# Patient Record
Sex: Male | Born: 1990 | Race: Black or African American | Hispanic: No | Marital: Single
Health system: Southern US, Community
[De-identification: ages and names within clinical notes are randomized; demographics above are authoritative.]

---
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
## 2011-05-14 ENCOUNTER — Emergency Department (INDEPENDENT_AMBULATORY_CARE_PROVIDER_SITE_OTHER)
Admission: EM | Admit: 2011-05-14 | Discharge: 2011-05-14 | Disposition: A | Discharge status: Home / Self Care | Payer: Self-pay | Source: Home / Self Care | Attending: Emergency Medicine | Admitting: Emergency Medicine

## 2011-05-14 ENCOUNTER — Encounter (HOSPITAL_COMMUNITY): Payer: Self-pay | Admitting: Emergency Medicine

## 2011-05-14 DIAGNOSIS — K529 Noninfective gastroenteritis and colitis, unspecified: Secondary | ICD-10-CM

## 2011-05-14 DIAGNOSIS — K5289 Other specified noninfective gastroenteritis and colitis: Secondary | ICD-10-CM

## 2011-05-14 MED ORDER — ONDANSETRON 8 MG PO TBDP: 8.0000 mg | ORAL_TABLET | Freq: Three times a day (TID) | ORAL | Status: AC | PRN

## 2011-05-14 MED ORDER — SODIUM CHLORIDE 0.9 % IV SOLN: Freq: Once | INTRAVENOUS | Status: AC

## 2011-05-14 MED ORDER — ONDANSETRON 4 MG PO TBDP
8.0000 mg | ORAL_TABLET | Freq: Once | ORAL | Status: AC
  Administered 2011-05-14: 8 mg via ORAL

## 2011-05-14 MED ORDER — SODIUM CHLORIDE 0.9 % IV SOLN
INTRAVENOUS | Status: DC
  Administered 2011-05-14: 17:00:00 via INTRAVENOUS

## 2011-05-14 MED ORDER — ONDANSETRON 4 MG PO TBDP
ORAL_TABLET | ORAL | Status: AC
  Filled 2011-05-14: qty 2

## 2011-05-14 MED ORDER — DIPHENOXYLATE-ATROPINE 2.5-0.025 MG PO TABS: 1.0000 | ORAL_TABLET | Freq: Four times a day (QID) | ORAL | Status: AC | PRN

## 2011-05-14 NOTE — Discharge Instructions (Signed)

## 2011-05-14 NOTE — ED Provider Notes (Signed)
Chief Complaint  Patient presents with  . Emesis    History of Present Illness:   The patient woke up at 1 AM this morning with nausea, vomiting, and diarrhea. There was no blood in the vomitus or stool. He's vomited about 6 times and had 6 or 7 loose stools. He has some pain in the upper abdomen and chest before he vomits or has diarrhea which is relieved by vomiting or having diarrhea. He's had chills and sweats. He denies any nasal congestion, rhinorrhea, sore throat, or cough. He was exposed to a similar illness in several family members.  Review of Systems:  Other than noted above, the patient denies any of the following symptoms: Constitutional:  No fever, chills, fatigue, weight loss or anorexia. Lungs:  No cough or shortness of breath. Heart:  No chest pain, palpitations, syncope or edema. Abdomen:  No nausea, vomiting, hematememesis, melena, diarrhea, or hematochezia. GU:  No dysuria, frequency, urgency, or hematuria. Skin:  No rash or itching.  PMFSH:  Past medical history, family history, social history, meds, and allergies were reviewed.  Physical Exam:   Vital signs:  BP 119/82  Pulse 82  Temp(Src) 98.9 F (37.2 C) (Oral)  Resp 16  SpO2 100% Gen:  Alert, oriented, in no distress. Lungs:  Breath sounds clear and equal bilaterally.  No wheezes, rales or rhonchi. Heart:  Regular rhythm.  No gallops or murmers.   Abdomen:  Abdomen was soft, flat, nondistended. He has mild, generalized tenderness to palpation, most markedly in the left lower quadrant and left flank area. There is no organomegaly or mass. Bowel sounds are normally active. Skin:  Clear, warm and dry.  No rash.  Course in Urgent Care Center:   He was given Zofran ODT 8 mg sublingually followed by 1 L of normal saline. Thereafter he felt considerably better. He denies any abdominal pain or vomiting or nausea. He felt like he could go home. He was instructed to follow only a liquid diet tonight and advance to a light  diet tomorrow.   Assessment:   Diagnoses that have been ruled out:  None  Diagnoses that are still under consideration:  None  Final diagnoses:  Gastroenteritis    Plan:   1.  The following meds were prescribed:   New Prescriptions   DIPHENOXYLATE-ATROPINE (LOMOTIL) 2.5-0.025 MG PER TABLET    Take 1 tablet by mouth 4 (four) times daily as needed for diarrhea or loose stools.   ONDANSETRON (ZOFRAN ODT) 8 MG DISINTEGRATING TABLET    Take 1 tablet (8 mg total) by mouth every 8 (eight) hours as needed for nausea.   2.  The patient was instructed in symptomatic care and handouts were given. 3.  The patient was told to return if becoming worse in any way, if no better in 3 or 4 days, and given some red flag symptoms that would indicate earlier return.   Roque Lias, MD 05/14/11 [phone]

## 2011-05-14 NOTE — ED Notes (Signed)
Pt having nausea, vomiting and diarrhea since last night. Not able to keep anything down.Abd pain before each emesis. No other complaints.

## 2011-09-19 ENCOUNTER — Emergency Department (HOSPITAL_COMMUNITY)
Admission: EM | Admit: 2011-09-19 | Discharge: 2011-09-19 | Disposition: A | Discharge status: Home / Self Care | Payer: Self-pay | Attending: Emergency Medicine | Admitting: Emergency Medicine

## 2011-09-19 ENCOUNTER — Encounter (HOSPITAL_COMMUNITY): Payer: Self-pay | Admitting: *Deleted

## 2011-09-19 DIAGNOSIS — K0889 Other specified disorders of teeth and supporting structures: Secondary | ICD-10-CM

## 2011-09-19 DIAGNOSIS — F17200 Nicotine dependence, unspecified, uncomplicated: Secondary | ICD-10-CM | POA: Insufficient documentation

## 2011-09-19 DIAGNOSIS — K089 Disorder of teeth and supporting structures, unspecified: Secondary | ICD-10-CM | POA: Insufficient documentation

## 2011-09-19 MED ORDER — NAPROXEN 500 MG PO TABS: 500.0000 mg | ORAL_TABLET | Freq: Two times a day (BID) | ORAL | Status: DC | PRN

## 2011-09-19 MED ORDER — IBUPROFEN 200 MG PO TABS
400.0000 mg | ORAL_TABLET | Freq: Once | ORAL | Status: AC
  Administered 2011-09-19: 400 mg via ORAL
  Filled 2011-09-19: qty 2

## 2011-09-19 MED ORDER — OXYCODONE-ACETAMINOPHEN 5-325 MG PO TABS: 1.0000 | ORAL_TABLET | ORAL | Status: AC | PRN

## 2011-09-19 NOTE — ED Notes (Signed)
Pt states he is having dental pain. Pt states dental has been increasing for the past year. Pt is c/o right side dental pain

## 2011-09-19 NOTE — ED Provider Notes (Signed)
History    21yM with toothache. Has botered intermittently for year. Acutely worse the last couple days. Right lower tooth. Denies trauma. No fevers or chills. Hot and cold sensitivity. No difficulty breathing or swallowing. No nausea or vomiting. Denies history of diabetes. Is a smoker.  CSN: [id number]  Arrival date & time 09/19/11  1950   First MD Initiated Contact with Patient 09/19/11 2057      Chief Complaint  Patient presents with  . Dental Pain    (Consider location/radiation/quality/duration/timing/severity/associated sxs/prior treatment) HPI  History reviewed. No pertinent past medical history.  History reviewed. No pertinent past surgical history.  No family history on file.  History  Substance Use Topics  . Smoking status: Current Everyday Smoker  . Smokeless tobacco: Not on file  . Alcohol Use: Yes     Occasional      Review of Systems   Review of symptoms negative unless otherwise noted in HPI.   Allergies  Review of patient's allergies indicates no known allergies.  Home Medications   Current Outpatient Rx  Name Route Sig Dispense Refill  . NAPROXEN 500 MG PO TABS Oral Take 1 tablet (500 mg total) by mouth 2 (two) times daily as needed. 20 tablet 0  . OXYCODONE-ACETAMINOPHEN 5-325 MG PO TABS Oral Take 1-2 tablets by mouth every 4 (four) hours as needed for pain. 15 tablet 0    BP 126/73  Pulse 63  Temp 98.5 F (36.9 C) (Oral)  Resp 18  SpO2 100%  Physical Exam  Nursing note and vitals reviewed. Constitutional: He appears well-developed and well-nourished. No distress.  HENT:  Head: Normocephalic and atraumatic.       R lower premolar cracked. No increasing pain with percussion. Mild surrounding gingivitis. No drainable collection. Posterior pharynx clear. No tongue elevation. Submental tissues soft. Neck supple. No stridor. No adenopathy.  Eyes: Conjunctivae are normal. Right eye exhibits no discharge. Left eye exhibits no discharge.    Neck: Normal range of motion. Neck supple.  Cardiovascular: Normal rate, regular rhythm and normal heart sounds.  Exam reveals no gallop and no friction rub.   No murmur heard. Pulmonary/Chest: Effort normal and breath sounds normal. No respiratory distress.  Musculoskeletal: He exhibits no edema and no tenderness.  Lymphadenopathy:    He has no cervical adenopathy.  Neurological: He is alert.  Skin: Skin is warm and dry.  Psychiatric: He has a normal mood and affect. His behavior is normal. Thought content normal.    ED Course  NERVE BLOCK Date/Time: 09/19/2011 11:23 PM Performed by: Raeford Razor Authorized by: Raeford Razor Consent: Verbal consent obtained. Patient identity confirmed: verbally with patient, arm band and provided demographic data Indications: pain relief Body area: face/mouth Nerve: inferior alveolar Laterality: right Patient sedated: no Patient position: sitting Needle gauge: 27 G Local anesthetic: lidocaine 2% with epinephrine Anesthetic total: 2 ml Outcome: pain improved Patient tolerance: Patient tolerated the procedure well with no immediate complications.   (including critical care time)  Labs Reviewed - No data to display No results found.   1. Toothache       MDM  21yM with toothache. No signs of infection. Nerve block done. Good analgesia. Additional pain medication and dental resources provided. Return precautions discussed.        Raeford Razor, MD 09/19/11 [phone]

## 2011-10-07 ENCOUNTER — Emergency Department (HOSPITAL_COMMUNITY)
Admission: EM | Admit: 2011-10-07 | Discharge: 2011-10-07 | Disposition: A | Discharge status: Home / Self Care | Payer: Self-pay | Attending: Emergency Medicine | Admitting: Emergency Medicine

## 2011-10-07 ENCOUNTER — Encounter (HOSPITAL_COMMUNITY): Payer: Self-pay

## 2011-10-07 DIAGNOSIS — R4182 Altered mental status, unspecified: Secondary | ICD-10-CM | POA: Insufficient documentation

## 2011-10-07 DIAGNOSIS — F17200 Nicotine dependence, unspecified, uncomplicated: Secondary | ICD-10-CM | POA: Insufficient documentation

## 2011-10-07 LAB — BASIC METABOLIC PANEL
BUN: 12 mg/dL (ref 6–23)
CO2: 22 mEq/L (ref 19–32)
Chloride: 102 mEq/L (ref 96–112)
Glucose, Bld: 111 mg/dL — ABNORMAL HIGH (ref 70–99)
Potassium: 3.6 mEq/L (ref 3.5–5.1)
Sodium: 139 mEq/L (ref 135–145)

## 2011-10-07 LAB — CBC WITH DIFFERENTIAL/PLATELET
Hemoglobin: 14.7 g/dL (ref 13.0–17.0)
Lymphocytes Relative: 43 % (ref 12–46)
Lymphs Abs: 3.6 10*3/uL (ref 0.7–4.0)
MCH: 31.3 pg (ref 26.0–34.0)
MCV: 87.6 fL (ref 78.0–100.0)
Monocytes Relative: 6 % (ref 3–12)
Neutrophils Relative %: 50 % (ref 43–77)
Platelets: 280 10*3/uL (ref 150–400)
RBC: 4.69 MIL/uL (ref 4.22–5.81)
WBC: 8.5 10*3/uL (ref 4.0–10.5)

## 2011-10-07 LAB — RAPID URINE DRUG SCREEN, HOSP PERFORMED
Amphetamines: NOT DETECTED
Barbiturates: NOT DETECTED
Benzodiazepines: NOT DETECTED
Cocaine: NOT DETECTED
Opiates: NOT DETECTED
Tetrahydrocannabinol: POSITIVE — AB

## 2011-10-07 NOTE — ED Notes (Addendum)
Per pt's friend, pt was out last night and came to her house about 30 min ago.  Pt states when she got up pt was unresponsive.  Pt unresponsive on arrival, but now appears lethargic.  Pt is shivering.  Pt will follow commands.  Pt admits ETOH last night, but denies drug use.  VSS.
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
## 2011-10-28 ENCOUNTER — Encounter (HOSPITAL_COMMUNITY): Payer: Self-pay | Admitting: *Deleted

## 2011-10-28 ENCOUNTER — Emergency Department (HOSPITAL_COMMUNITY)
Admission: EM | Admit: 2011-10-28 | Discharge: 2011-10-28 | Disposition: A | Discharge status: Home / Self Care | Payer: Self-pay | Attending: Emergency Medicine | Admitting: Emergency Medicine

## 2011-10-28 DIAGNOSIS — K089 Disorder of teeth and supporting structures, unspecified: Secondary | ICD-10-CM | POA: Insufficient documentation

## 2011-10-28 DIAGNOSIS — F17200 Nicotine dependence, unspecified, uncomplicated: Secondary | ICD-10-CM | POA: Insufficient documentation

## 2011-10-28 DIAGNOSIS — K047 Periapical abscess without sinus: Secondary | ICD-10-CM

## 2011-10-28 MED ORDER — IBUPROFEN 800 MG PO TABS: 800.0000 mg | ORAL_TABLET | Freq: Three times a day (TID) | ORAL | Status: AC

## 2011-10-28 MED ORDER — HYDROCODONE-ACETAMINOPHEN 5-500 MG PO TABS: 2.0000 | ORAL_TABLET | Freq: Four times a day (QID) | ORAL | Status: AC | PRN

## 2011-10-28 MED ORDER — PENICILLIN V POTASSIUM 500 MG PO TABS: 500.0000 mg | ORAL_TABLET | Freq: Four times a day (QID) | ORAL | Status: AC

## 2011-10-28 NOTE — ED Notes (Signed)
Pt ambulated with a steady gait; VSS; A&Ox3; no signs of distress; respirations even and unalbored; skin warm and dry. No questions.

## 2011-10-28 NOTE — ED Notes (Signed)
MD at bedside for dental block 

## 2011-10-28 NOTE — ED Provider Notes (Signed)
History     CSN: [id number]  Arrival date & time 10/28/11  2440   First MD Initiated Contact with Patient 10/28/11 0515      Chief Complaint  Patient presents with  . Dental Pain    (Consider location/radiation/quality/duration/timing/severity/associated sxs/prior treatment) HPI History provided by patient. Right lower molar pain. Onset yesterday and worsening today. No fevers. No vomiting. No trouble breathing or swallowing. No facial swelling. Hurts to chew. No known alleviating factors. Taking Aleve without relief. Pain is sharp in quality and not radiating. History reviewed. No pertinent past medical history.  History reviewed. No pertinent past surgical history.  No family history on file.  History  Substance Use Topics  . Smoking status: Current Everyday Smoker  . Smokeless tobacco: Not on file  . Alcohol Use: Yes     Occasional      Review of Systems  Constitutional: Negative for fever and chills.  HENT: Positive for dental problem. Negative for neck pain and neck stiffness.   Eyes: Negative for pain.  Respiratory: Negative for shortness of breath.   Cardiovascular: Negative for chest pain.  Gastrointestinal: Negative for abdominal pain.  Genitourinary: Negative for dysuria.  Musculoskeletal: Negative for back pain.  Skin: Negative for rash.  Neurological: Negative for headaches.  All other systems reviewed and are negative.    Allergies  Review of patient's allergies indicates no known allergies.  Home Medications   Current Outpatient Rx  Name Route Sig Dispense Refill  . NAPROXEN SODIUM 220 MG PO TABS Oral Take 440 mg by mouth 2 (two) times daily with a meal. pain      BP 117/65  Pulse 56  Temp 98.5 F (36.9 C) (Oral)  Resp 16  SpO2 99%  Physical Exam  Constitutional: He is oriented to person, place, and time. He appears well-developed and well-nourished.  HENT:  Head: Normocephalic.       Right lower first molar tenderness without  associated gingival swelling. Uvula midline. No trismus. No associated facial swelling or erythema. No cervical lymphadenopathy.  Eyes: Conjunctivae and EOM are normal. Pupils are equal, round, and reactive to light.  Neck: Trachea normal. Neck supple. No thyromegaly present.  Cardiovascular: Normal rate, regular rhythm, S1 normal, S2 normal and normal pulses.     No systolic murmur is present   No diastolic murmur is present  Pulses:      Radial pulses are 2+ on the right side, and 2+ on the left side.  Pulmonary/Chest: Effort normal and breath sounds normal. He has no wheezes. He has no rhonchi. He has no rales. He exhibits no tenderness.  Abdominal: Soft. Normal appearance and bowel sounds are normal. There is no tenderness. There is no CVA tenderness and negative Murphy's sign.  Musculoskeletal:       BLE:s Calves nontender, no cords or erythema, negative Homans sign  Neurological: He is alert and oriented to person, place, and time. He has normal strength. No cranial nerve deficit or sensory deficit. GCS eye subscore is 4. GCS verbal subscore is 5. GCS motor subscore is 6.  Skin: Skin is warm and dry. No rash noted. He is not diaphoretic.  Psychiatric: His speech is normal.       Cooperative and appropriate    ED Course  Dental Date/Time: 10/28/2011 5:57 AM Performed by: Sunnie Nielsen Authorized by: Sunnie Nielsen Consent: Verbal consent obtained. Risks and benefits: risks, benefits and alternatives were discussed Consent given by: patient Patient understanding: patient states understanding of the procedure  being performed Patient consent: the patient's understanding of the procedure matches consent given Procedure consent: procedure consent matches procedure scheduled Required items: required blood products, implants, devices, and special equipment available Patient identity confirmed: verbally with patient Time out: Immediately prior to procedure a "time out" was called to verify  the correct patient, procedure, equipment, support staff and site/side marked as required. Local anesthesia used: yes Anesthesia: local infiltration Local anesthetic: bupivacaine 0.5% without epinephrine Anesthetic total: 1.8 ml Patient tolerance: Patient tolerated the procedure well with no immediate complications. Comments: Local dental block right lower first molar.   (including critical care time)  Dental block as above  Plan antibiotics and prescription for pain medications. Continue Aleve. Dental referral provided. Reliable historian states understanding all discharge and followup instructions and strict return precautions.   MDM   Nursing notes reviewed. Vital signs reviewed. Dental block as above. Prescriptions provided.       Sunnie Nielsen, MD 10/28/11 [phone]

## 2011-10-28 NOTE — ED Notes (Signed)
C/o R lower molar toothache, has holes and cavities in teeth, h/o same months ago, pain returned yesterday. Also mentions R sided HA. Denies sx other than pain.denies fever or drainage.

## 2011-12-02 ENCOUNTER — Encounter (HOSPITAL_COMMUNITY): Payer: Self-pay | Admitting: Emergency Medicine

## 2011-12-02 ENCOUNTER — Emergency Department (HOSPITAL_COMMUNITY)
Admission: EM | Admit: 2011-12-02 | Discharge: 2011-12-02 | Disposition: A | Discharge status: Home / Self Care | Payer: Self-pay | Attending: Emergency Medicine | Admitting: Emergency Medicine

## 2011-12-02 DIAGNOSIS — R112 Nausea with vomiting, unspecified: Secondary | ICD-10-CM | POA: Insufficient documentation

## 2011-12-02 DIAGNOSIS — F17200 Nicotine dependence, unspecified, uncomplicated: Secondary | ICD-10-CM | POA: Insufficient documentation

## 2011-12-02 LAB — CBC WITH DIFFERENTIAL/PLATELET
Eosinophils Absolute: 0.1 10*3/uL (ref 0.0–0.7)
Eosinophils Relative: 1 % (ref 0–5)
Hemoglobin: 14.7 g/dL (ref 13.0–17.0)
Lymphs Abs: 3.3 10*3/uL (ref 0.7–4.0)
MCH: 31.5 pg (ref 26.0–34.0)
MCV: 88.2 fL (ref 78.0–100.0)
Monocytes Relative: 8 % (ref 3–12)
Platelets: 222 10*3/uL (ref 150–400)
RBC: 4.66 MIL/uL (ref 4.22–5.81)

## 2011-12-02 LAB — COMPREHENSIVE METABOLIC PANEL
BUN: 8 mg/dL (ref 6–23)
Calcium: 9.7 mg/dL (ref 8.4–10.5)
GFR calc Af Amer: >90 mL/min (ref >90)
Glucose, Bld: 104 mg/dL — ABNORMAL HIGH (ref 70–99)
Total Protein: 7.5 g/dL (ref 6.0–8.3)

## 2011-12-02 MED ORDER — ONDANSETRON HCL 4 MG/2ML IJ SOLN
4.0000 mg | Freq: Once | INTRAMUSCULAR | Status: AC
  Administered 2011-12-02: 4 mg via INTRAVENOUS
  Filled 2011-12-02: qty 2

## 2011-12-02 MED ORDER — SODIUM CHLORIDE 0.9 % IV SOLN
1000.0000 mL | Freq: Once | INTRAVENOUS | Status: AC
  Administered 2011-12-02: 1000 mL via INTRAVENOUS

## 2011-12-02 MED ORDER — PROMETHAZINE HCL 25 MG/ML IJ SOLN
12.5000 mg | Freq: Once | INTRAMUSCULAR | Status: AC
  Administered 2011-12-02: 12.5 mg via INTRAVENOUS
  Filled 2011-12-02: qty 1

## 2011-12-02 NOTE — ED Provider Notes (Signed)
Medical screening examination/treatment/procedure(s) were performed by non-physician practitioner and as supervising physician I was immediately available for consultation/collaboration.   Lyanne Co, MD 12/02/11 [phone]
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
## 2012-04-16 IMAGING — CR DG ABDOMEN ACUTE W/ 1V CHEST
3 series · 3 of 3 positions shown · non-contrast
Comparison: CT abdomen and pelvis 05/04/2008 and 04/27/2003.

CLINICAL DATA: Abdominal pain.  Nausea and vomiting.

ACUTE ABDOMEN SERIES (ABDOMEN 2 VIEW & CHEST 1 VIEW) 11/14/2009:

[w chest pa]
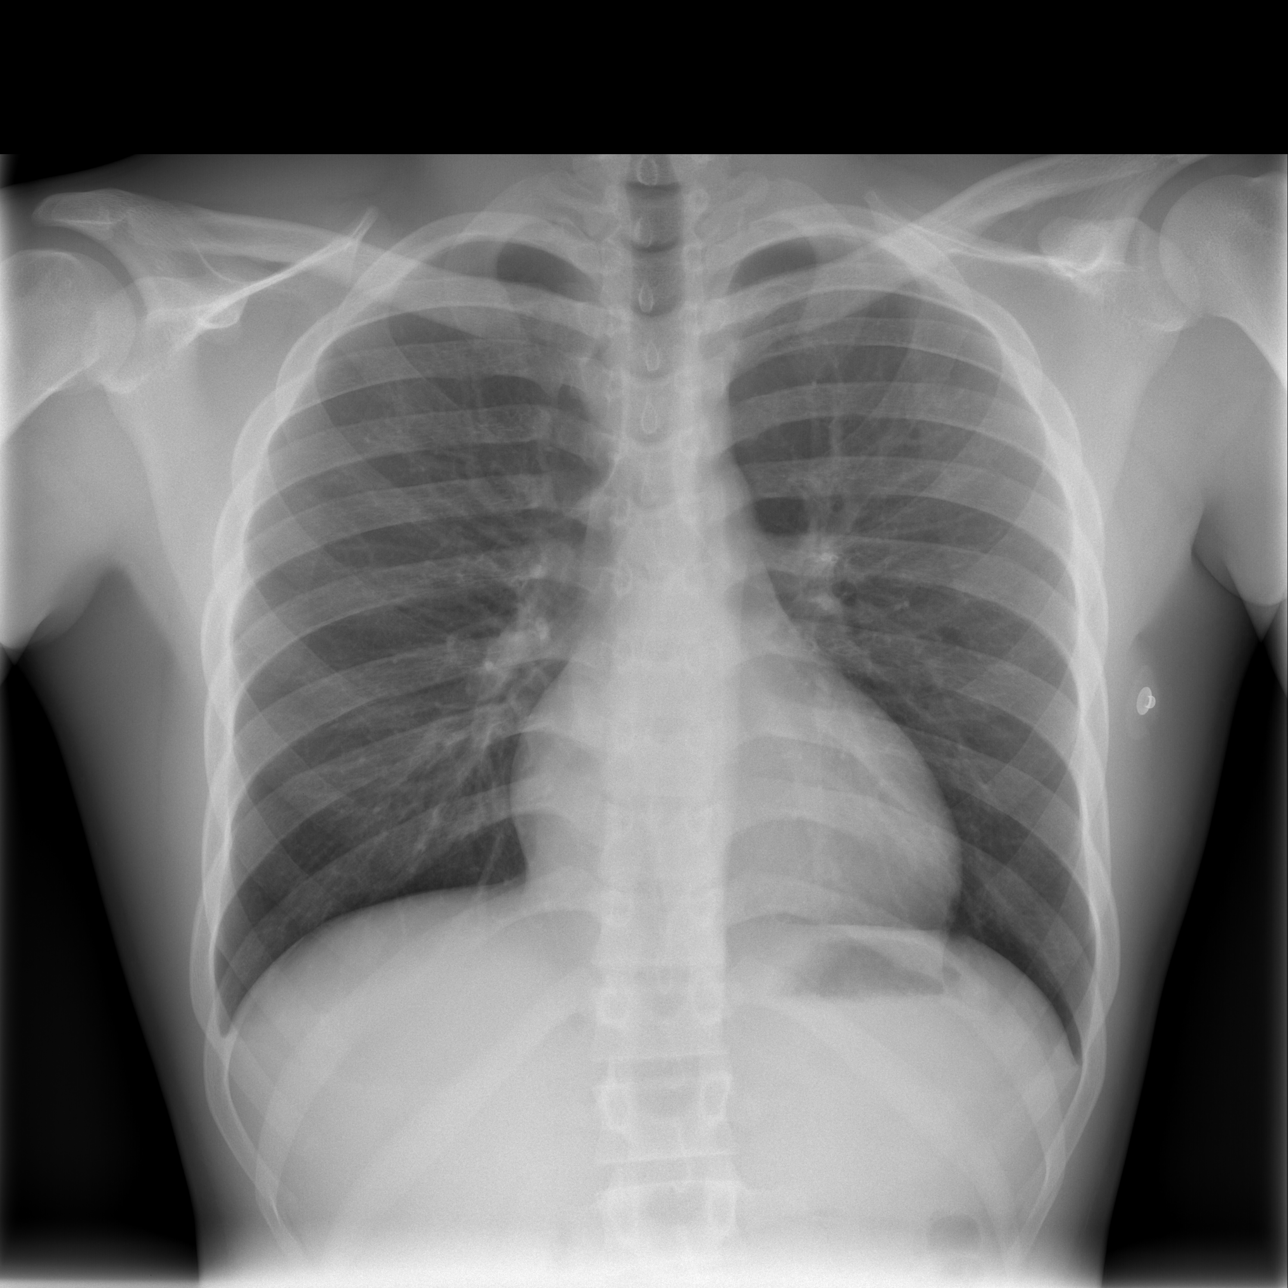

[w abdomen upright *]
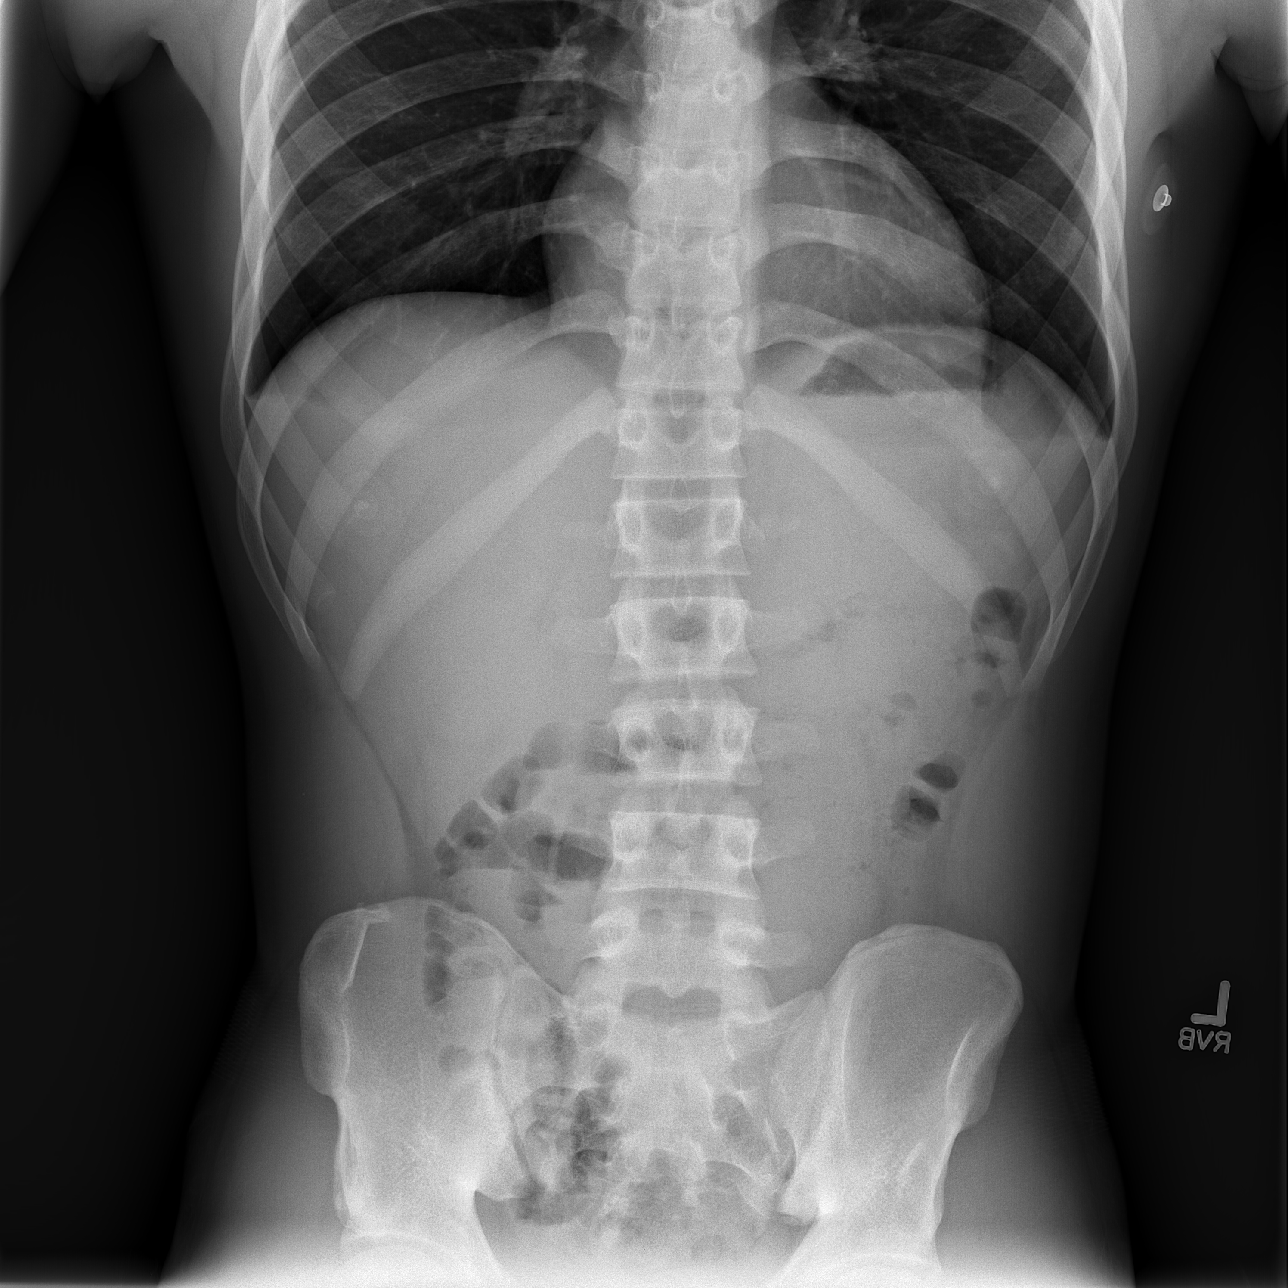

[t abdomen supine]
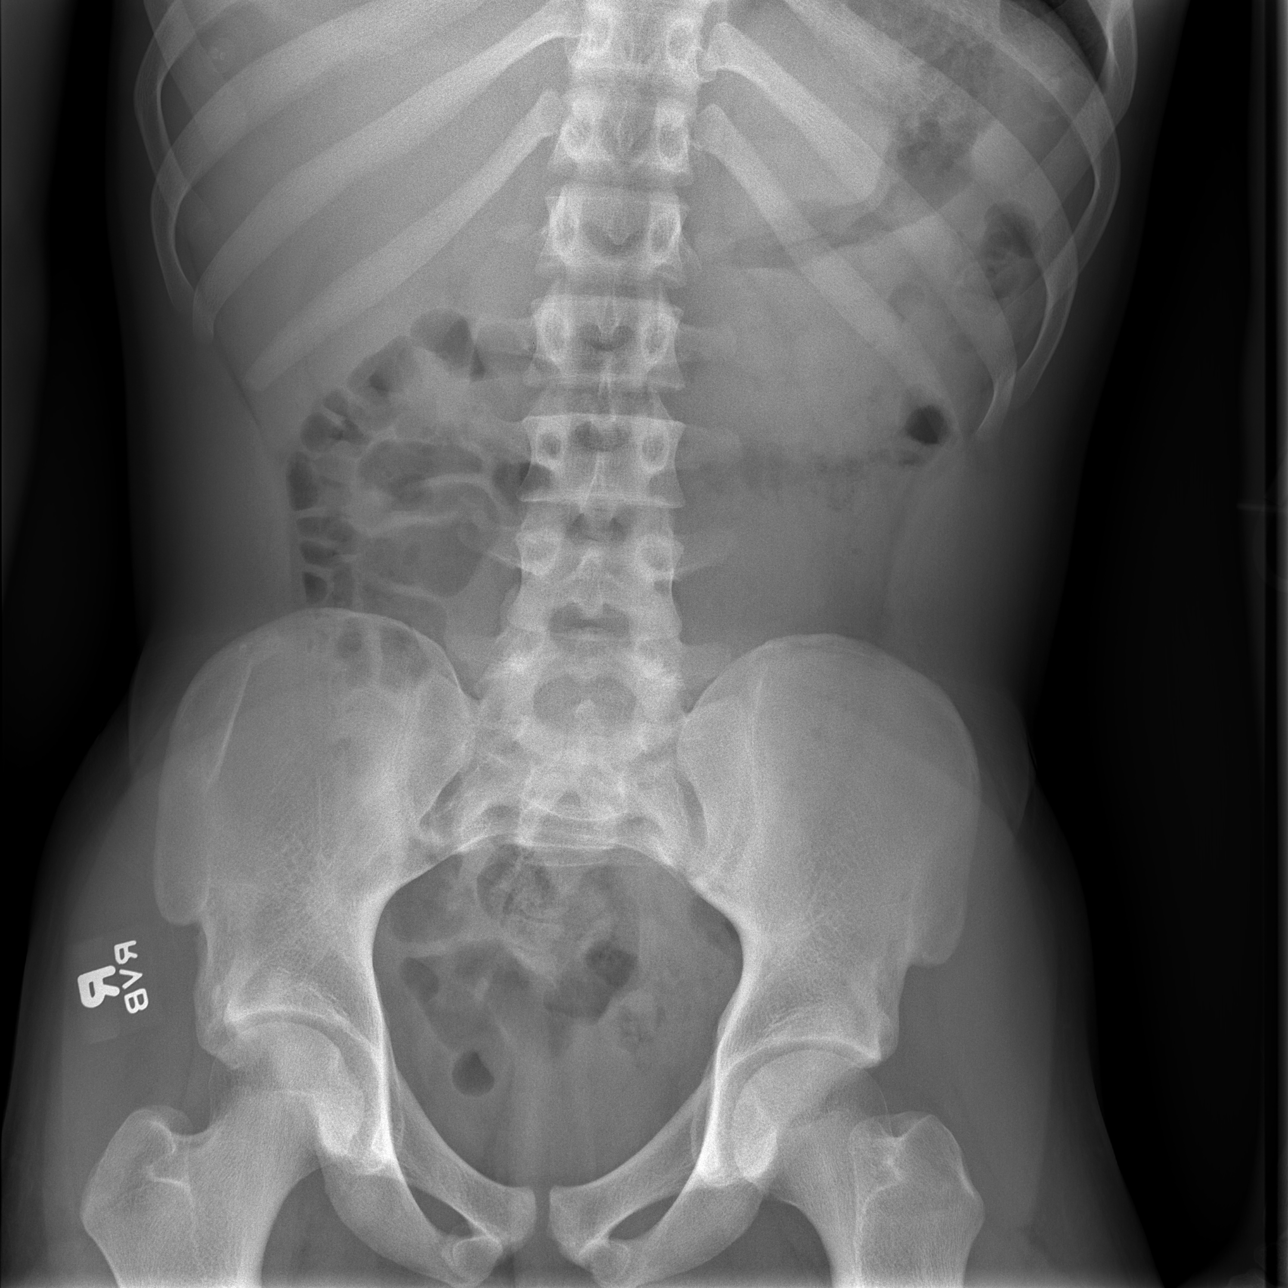

[3 of 3 positions shown; findings below may reference images not displayed]

FINDINGS: Bowel gas pattern unremarkable without evidence of
obstruction or significant ileus.  No evidence of free air or
significant air fluid levels on the erect image.  No abnormal
calcifications.  Regional skeleton unremarkable.

Cardiomediastinal silhouette unremarkable for age.  Lungs clear.
No pleural effusions.
IMPRESSION: No acute abdominal or pulmonary abnormality.

## 2012-04-16 IMAGING — CT CT ABD-PELV W/ CM
3 of 4 series · 16 of 46 positions shown, 20 images · IV contrast (APPLIED)
Comparison: 05/04/2008

CLINICAL DATA: Abdominal pain.

CT ABDOMEN AND PELVIS WITH CONTRAST
TECHNIQUE: Multidetector CT imaging of the abdomen and pelvis was
performed following the standard protocol during bolus
administration of intravenous contrast.
Contrast: 125 ml of 8mnipaque-PTT

[Series 2: abd_pel 5.0 b40f st · axial · 0.65mm/px · z∈[-462,-66]mm · 12 of 91 slices shown, 16 images]
[im 8/91  soft-tissue]
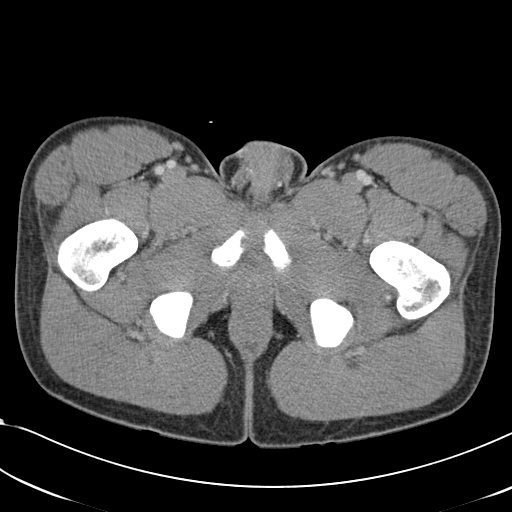
[im 8/91  bone]
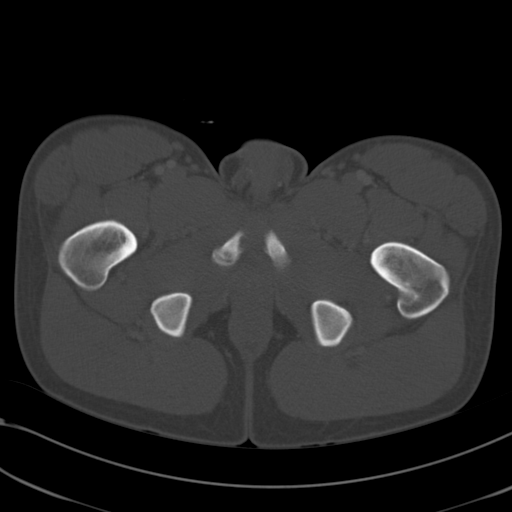
[im 16/91  soft-tissue]
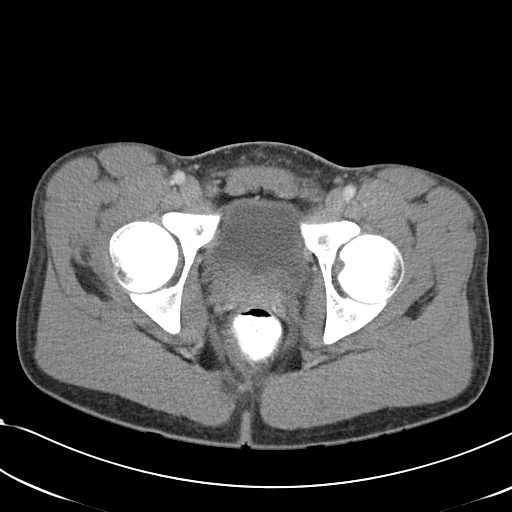
[im 23/91  soft-tissue]
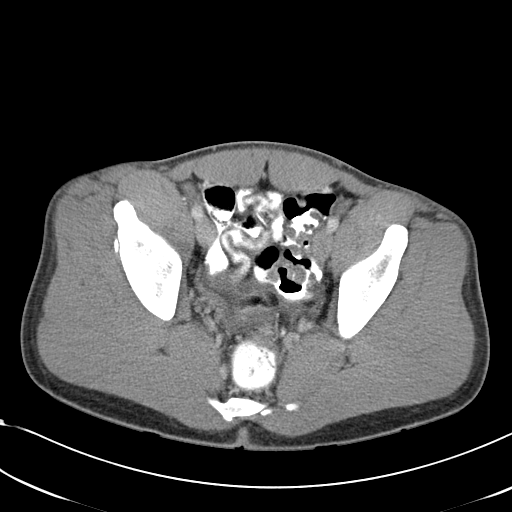
[im 34/91  soft-tissue]
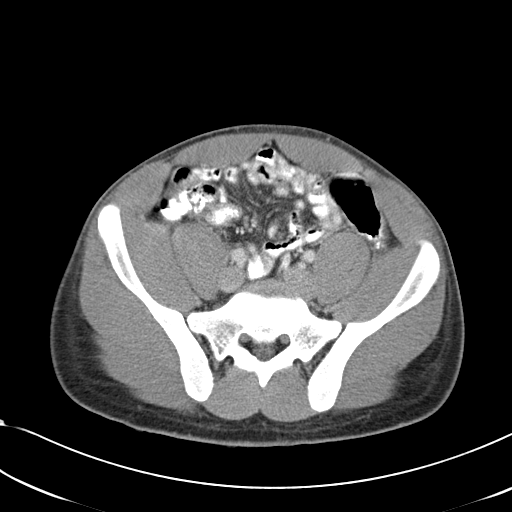
[im 42/91  soft-tissue]
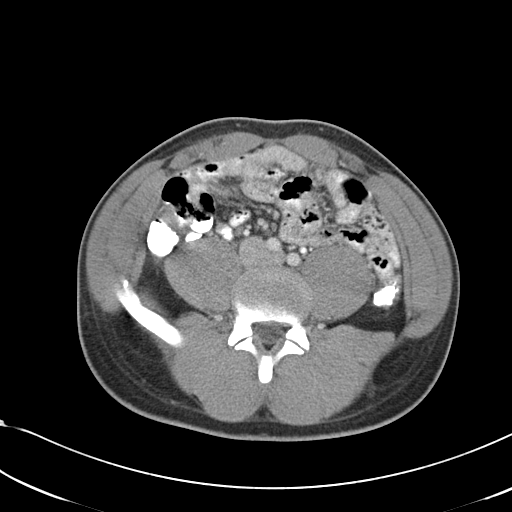
[im 49/91  soft-tissue]
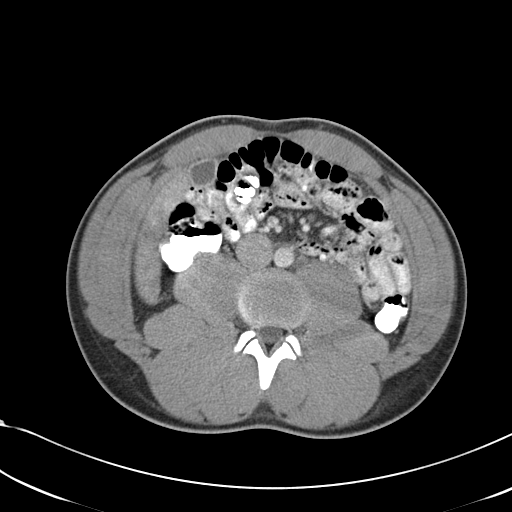
[im 57/91  soft-tissue]
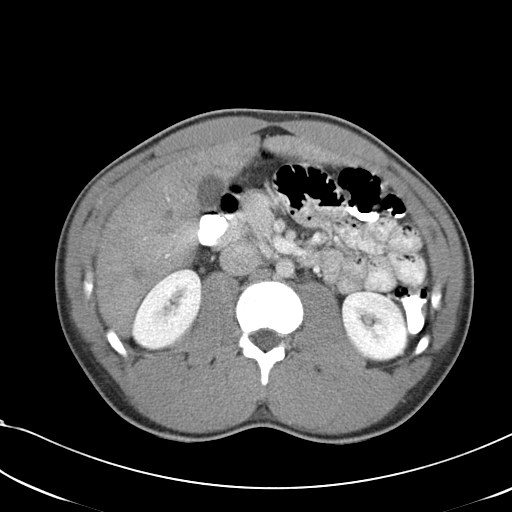
[im 68/91  soft-tissue]
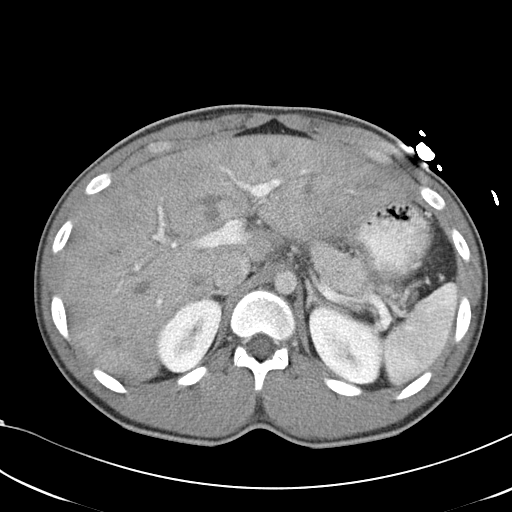
[im 76/91  soft-tissue]
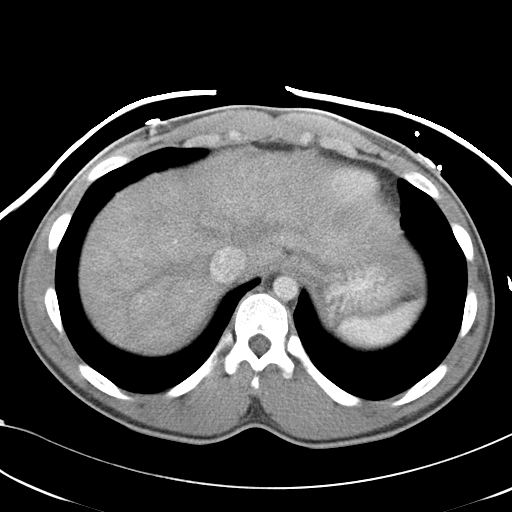
[im 76/91  lung]
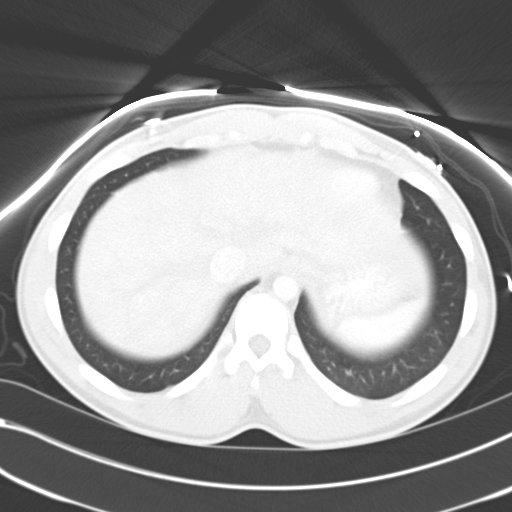
[im 76/91  bone]
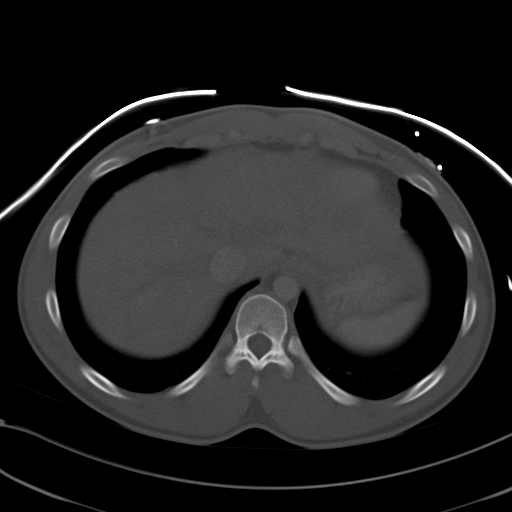
[im 79/91  lung]
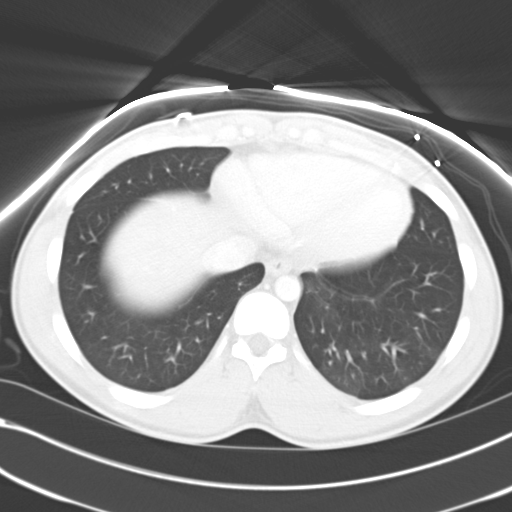
[im 83/91  soft-tissue]
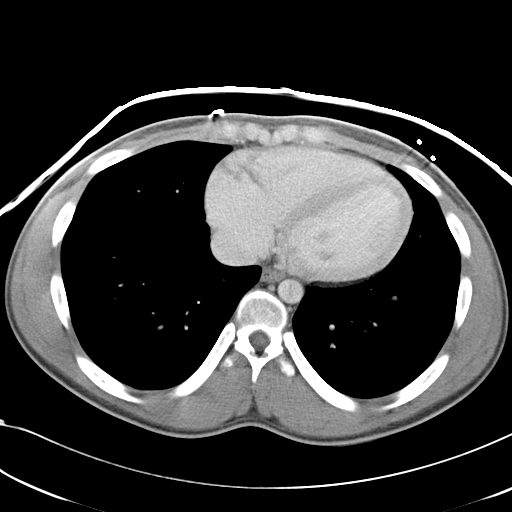
[im 83/91  lung]
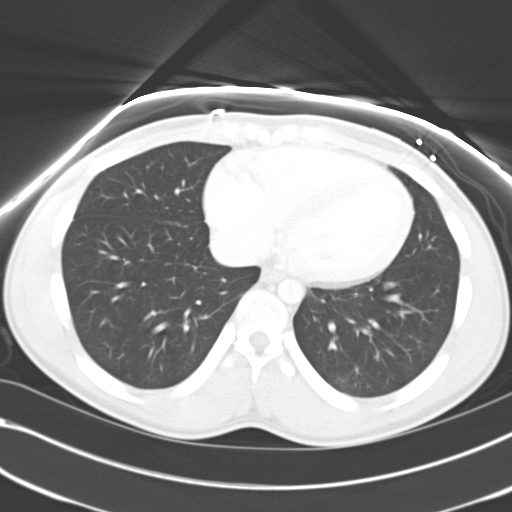
[im 87/91  lung]
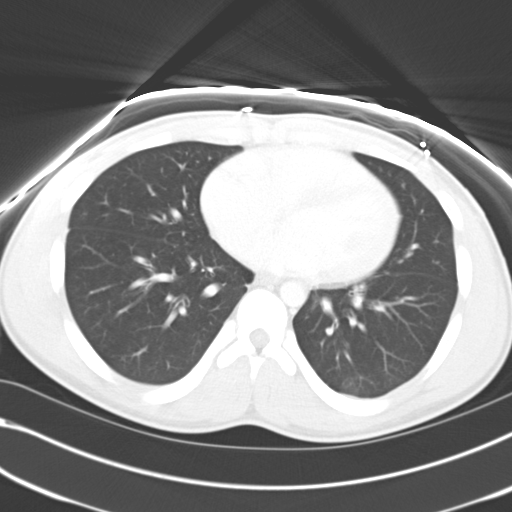

[Series 602: coronal abdomen · coronal · 0.92mm/px · 3 of 104 slices shown]
[im 35/104  soft-tissue]
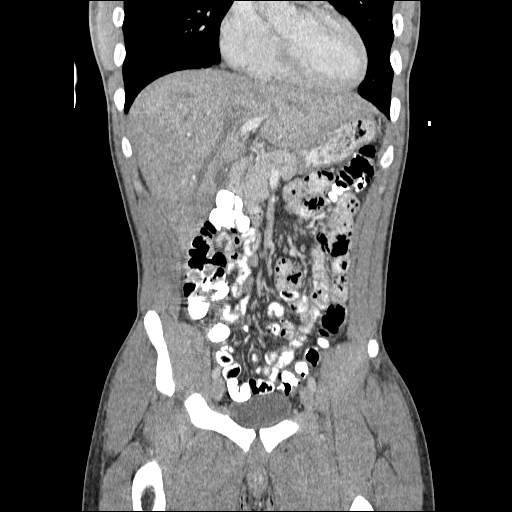
[im 46/104  soft-tissue]
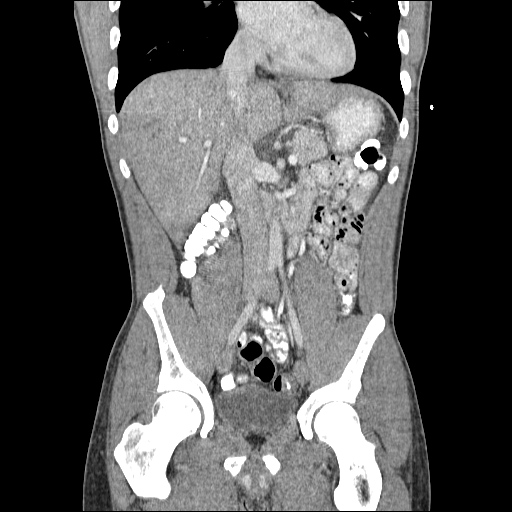
[im 58/104  soft-tissue]
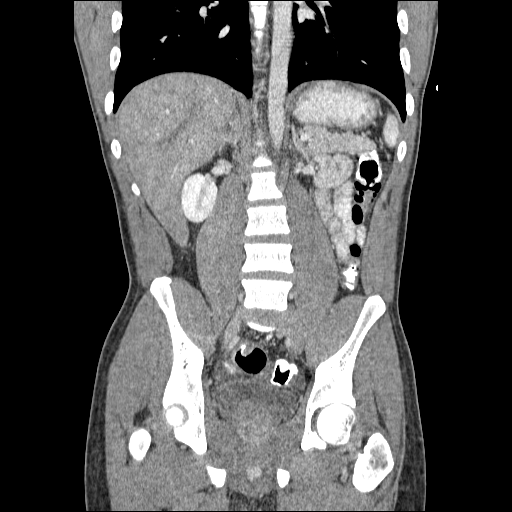

[Series 603: sagittal abdomen · sagittal · 0.92mm/px · 1 of 146 slices shown]
[im 49/146  soft-tissue]
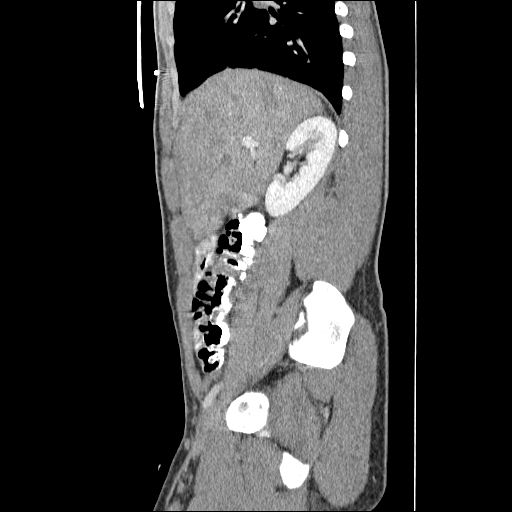

[16 of 46 positions shown; findings below may reference images not displayed]

FINDINGS: The liver, spleen, pancreas, adrenal glands, and kidneys
are normal.  The visualized bowel is normal including the terminal
ileum and appendix.  There is no free air or free fluid.  No
osseous abnormality.
IMPRESSION: Benign-appearing abdomen and pelvis.

## 2012-10-07 ENCOUNTER — Encounter (HOSPITAL_COMMUNITY): Payer: Self-pay

## 2012-10-07 ENCOUNTER — Emergency Department (HOSPITAL_COMMUNITY)
Admission: EM | Admit: 2012-10-07 | Discharge: 2012-10-07 | Disposition: A | Discharge status: Home / Self Care | Payer: Self-pay | Attending: Emergency Medicine | Admitting: Emergency Medicine

## 2012-10-07 DIAGNOSIS — K0889 Other specified disorders of teeth and supporting structures: Secondary | ICD-10-CM

## 2012-10-07 DIAGNOSIS — J029 Acute pharyngitis, unspecified: Secondary | ICD-10-CM

## 2012-10-07 DIAGNOSIS — F17200 Nicotine dependence, unspecified, uncomplicated: Secondary | ICD-10-CM | POA: Insufficient documentation

## 2012-10-07 DIAGNOSIS — K089 Disorder of teeth and supporting structures, unspecified: Secondary | ICD-10-CM | POA: Insufficient documentation

## 2012-10-07 LAB — RAPID STREP SCREEN (MED CTR MEBANE ONLY): Streptococcus, Group A Screen (Direct): NEGATIVE

## 2012-10-07 MED ORDER — PENICILLIN V POTASSIUM 500 MG PO TABS: 500.0000 mg | ORAL_TABLET | Freq: Four times a day (QID) | ORAL | Status: DC

## 2012-10-07 MED ORDER — TRAMADOL HCL 50 MG PO TABS: 50.0000 mg | ORAL_TABLET | Freq: Four times a day (QID) | ORAL | Status: DC | PRN

## 2012-10-07 MED ORDER — PENICILLIN V POTASSIUM 500 MG PO TABS
500.0000 mg | ORAL_TABLET | Freq: Once | ORAL | Status: AC
  Administered 2012-10-07: 500 mg via ORAL
  Filled 2012-10-07: qty 1

## 2012-10-07 MED ORDER — TRAMADOL HCL 50 MG PO TABS
50.0000 mg | ORAL_TABLET | Freq: Once | ORAL | Status: AC
  Administered 2012-10-07: 50 mg via ORAL
  Filled 2012-10-07: qty 1

## 2012-10-07 NOTE — ED Provider Notes (Signed)
History    CSN: [id number] Arrival date & time 10/07/12  0848  First MD Initiated Contact with Patient 10/07/12 [phone]     Chief Complaint  Patient presents with  . Dental Pain   (Consider location/radiation/quality/duration/timing/severity/associated sxs/prior Treatment) HPI Comments: The patient is a 22 year old otherwise healthy male who presents with dental pain that started gradually 5 days ago. The dental pain is severe, constant and progressively worsening. The pain is aching and located in right lower jaw. The pain does not radiate. Eating makes the pain worse. Nothing makes the pain better. The patient has not tried anything for pain. No associated symptoms. Patient denies headache, neck pain/stiffness, fever, NVD, edema, sore throat, throat swelling, wheezing, SOB, chest pain, abdominal pain.   Patient also complains of sore throat. Patient reports gradual onset and progressively worsening sharp, severe throat pain that started this morning. The pain is constant and made worse with swallowing. The pain is localized to the patient's throat and equal on both sides. Nothing alleviates the pain. The patient has not tried anything for symptom relief. Patient reports associated subjective fever, cervical adenopathy, and non productive cough. Patient denies headache, visual changes, sinus congestion, difficulty breathing, chest pain, SOB, abdominal pain, NVD.      Patient is a 22 y.o. male presenting with tooth pain.  Dental Pain  History reviewed. No pertinent past medical history. History reviewed. No pertinent past surgical history. Family History  Problem Relation Age of Onset  . Asthma Mother   . Asthma Sister   . Asthma Brother    History  Substance Use Topics  . Smoking status: Current Some Day Smoker    Types: Cigarettes  . Smokeless tobacco: Never Used  . Alcohol Use: Yes     Comment: Occasional    Review of Systems  HENT: Positive for sore throat and dental  problem.   All other systems reviewed and are negative.    Allergies  Review of patient's allergies indicates no known allergies.  Home Medications  No current outpatient prescriptions on file. BP 142/69  Pulse 64  Temp(Src) 98.7 F (37.1 C) (Oral)  Resp 14  Ht 5\' 7"  (1.702 m)  Wt 151 lb (68.493 kg)  BMI 23.64 kg/m2  SpO2 100% Physical Exam  Nursing note and vitals reviewed. Constitutional: He is oriented to person, place, and time. He appears well-developed and well-nourished. No distress.  HENT:  Head: Normocephalic and atraumatic.  Mouth/Throat: No oropharyngeal exudate.  Bilateral tonsillar edema and erythema. Poor dentition. Multiple fillings and right lower jaw tenderness to percussion.   Eyes: Conjunctivae and EOM are normal.  Neck: Normal range of motion.  Cardiovascular: Normal rate and regular rhythm.  Exam reveals no gallop and no friction rub.   No murmur heard. Pulmonary/Chest: Effort normal and breath sounds normal. He has no wheezes. He has no rales. He exhibits no tenderness.  Abdominal: Soft. There is no tenderness.  Musculoskeletal: Normal range of motion.  Lymphadenopathy:    He has no cervical adenopathy.  Neurological: He is alert and oriented to person, place, and time.  Speech is goal-oriented. Moves limbs without ataxia.   Skin: Skin is warm and dry.  Psychiatric: He has a normal mood and affect. His behavior is normal.    ED Course  Procedures (including critical care time) Labs Reviewed  RAPID STREP SCREEN  CULTURE, GROUP A STREP   No results found.  1. Pain, dental   2. Sore throat     MDM  9:29 AM Patient will have tramadol and veetid. Rapid strep test pending. Vitals stable and patient is afebrile.   10:07 AM Strep test negative. Sample will be sent for culture and patient will be contacted with positive results. Patient will be discharged with Tramadol and Veetid. Patient received resource guide for dentist follow up. Patient  instructed to return with worsening or concerning symptoms.   Emilia Beck, PA-C 10/07/12 1012

## 2012-10-07 NOTE — Progress Notes (Signed)
[facility] has seen patient and provided him with a list of primary care resources, as well as, a list of dental resources.

## 2012-10-07 NOTE — ED Notes (Signed)
Patient reports that he has broken tooth right lower jaw. Patient has slight right facial and gum swelling. Patient also c/o sore throat when he swallows. Patient denies any breathing problems.

## 2012-10-07 NOTE — ED Provider Notes (Signed)
Medical screening examination/treatment/procedure(s) were performed by non-physician practitioner and as supervising physician I was immediately available for consultation/collaboration.  Dariela Stoker T Audry Kauzlarich, MD 10/07/12 1117 

## 2012-10-09 LAB — CULTURE, GROUP A STREP

## 2012-10-13 ENCOUNTER — Emergency Department (HOSPITAL_COMMUNITY)
Admission: EM | Admit: 2012-10-13 | Discharge: 2012-10-13 | Disposition: A | Discharge status: Home / Self Care | Payer: Self-pay | Attending: Emergency Medicine | Admitting: Emergency Medicine

## 2012-10-13 ENCOUNTER — Emergency Department (HOSPITAL_COMMUNITY): Admission: EM | Admit: 2012-10-13 | Discharge: 2012-10-13 | Discharge status: Against Medical Advice | Payer: Self-pay

## 2012-10-13 ENCOUNTER — Encounter (HOSPITAL_COMMUNITY): Payer: Self-pay | Admitting: Emergency Medicine

## 2012-10-13 DIAGNOSIS — K029 Dental caries, unspecified: Secondary | ICD-10-CM | POA: Insufficient documentation

## 2012-10-13 DIAGNOSIS — F17200 Nicotine dependence, unspecified, uncomplicated: Secondary | ICD-10-CM | POA: Insufficient documentation

## 2012-10-13 DIAGNOSIS — Z888 Allergy status to other drugs, medicaments and biological substances status: Secondary | ICD-10-CM | POA: Insufficient documentation

## 2012-10-13 DIAGNOSIS — H9209 Otalgia, unspecified ear: Secondary | ICD-10-CM | POA: Insufficient documentation

## 2012-10-13 DIAGNOSIS — K0889 Other specified disorders of teeth and supporting structures: Secondary | ICD-10-CM

## 2012-10-13 DIAGNOSIS — Z880 Allergy status to penicillin: Secondary | ICD-10-CM | POA: Insufficient documentation

## 2012-10-13 DIAGNOSIS — K089 Disorder of teeth and supporting structures, unspecified: Secondary | ICD-10-CM | POA: Insufficient documentation

## 2012-10-13 DIAGNOSIS — T360X5A Adverse effect of penicillins, initial encounter: Secondary | ICD-10-CM | POA: Insufficient documentation

## 2012-10-13 MED ORDER — AZITHROMYCIN 250 MG PO TABS: 250.0000 mg | ORAL_TABLET | Freq: Every day | ORAL | Status: DC

## 2012-10-13 NOTE — ED Notes (Signed)
Pt was seen last week for dental pain, given penicillin, states took PCN for 3 days and felt throat was closing up after taking 1st time, states dental pain is worse, did not fill get  tramadol filled

## 2012-10-13 NOTE — ED Notes (Signed)
Called pt to Triage X 2 and no answer.

## 2012-10-13 NOTE — ED Provider Notes (Signed)
CSN: [id number]     Arrival date & time 10/13/12  1321 History     First MD Initiated Contact with Patient 10/13/12 1332     Chief Complaint  Patient presents with  . Dental Pain  . Allergic Reaction   (Consider location/radiation/quality/duration/timing/severity/associated sxs/prior Treatment) HPI Comments: The patient is a 22 year old male who presents today with continued dental pain. He was evaluated here 5 days ago and given a prescription for penicillin and tramadol. He states the penicillin made him feel like his throat was swelling up so he has not continued the prescription. He did not fill the tramadol because he states he does not like taking pills. He states his pain is worsening. It feels as though there is something tearing at his gum. The pain radiates to his right ear. His right lower molar is getting looser. He did not followup with a dentist. He reports he is having night sweats, although he did not measure his temperature. He is not having abdominal pain.   Patient is a 22 y.o. male presenting with tooth pain and allergic reaction. The history is provided by the patient. No language interpreter was used.  Dental Pain Allergic Reaction   History reviewed. No pertinent past medical history. History reviewed. No pertinent past surgical history. Family History  Problem Relation Age of Onset  . Asthma Mother   . Asthma Sister   . Asthma Brother    History  Substance Use Topics  . Smoking status: Current Some Day Smoker    Types: Cigarettes  . Smokeless tobacco: Never Used  . Alcohol Use: Yes     Comment: Occasional    Review of Systems  HENT: Positive for dental problem.   All other systems reviewed and are negative.    Allergies  Penicillins  Home Medications   Current Outpatient Rx  Name  Route  Sig  Dispense  Refill  . acetaminophen (TYLENOL) 500 MG tablet   Oral   Take 1,500 mg by mouth every 6 (six) hours as needed for pain.         . traMADol  (ULTRAM) 50 MG tablet   Oral   Take 1 tablet (50 mg total) by mouth every 6 (six) hours as needed for pain.   15 tablet   0    BP 157/91  Pulse 79  Temp(Src) 98.4 F (36.9 C) (Oral)  Resp 18  SpO2 99% Physical Exam  Nursing note and vitals reviewed. Constitutional: He is oriented to person, place, and time. He appears well-developed and well-nourished. No distress.  HENT:  Head: Normocephalic and atraumatic. No trismus in the jaw.  Right Ear: External ear normal.  Left Ear: Tympanic membrane, external ear and ear canal normal.  Nose: Nose normal.  Mouth/Throat: Uvula is midline and oropharynx is clear and moist. Dental caries present. No tonsillar abscesses.    No trismus, submental edema, tongue elevation. No drainable abscess.  Cerumen impaction in right ear canal.   Eyes: Conjunctivae are normal.  Neck: Normal range of motion. No tracheal deviation present.  Cardiovascular: Normal rate, regular rhythm and normal heart sounds.   Pulmonary/Chest: Effort normal and breath sounds normal. No stridor.  Abdominal: Soft. He exhibits no distension. There is no tenderness.  Musculoskeletal: Normal range of motion.  Lymphadenopathy:    He has no cervical adenopathy.  Neurological: He is alert and oriented to person, place, and time.  Skin: Skin is warm and dry. He is not diaphoretic.  Psychiatric: He has a  normal mood and affect. His behavior is normal.    ED Course   Dental Date/Time: 10/13/2012 2:08 PM Performed by: Mora Bellman Authorized by: Mora Bellman Consent: Verbal consent obtained. written consent not obtained. The procedure was performed in an emergent situation. Risks and benefits: risks, benefits and alternatives were discussed Consent given by: patient Patient understanding: patient states understanding of the procedure being performed Patient consent: the patient's understanding of the procedure matches consent given Required items: required blood  products, implants, devices, and special equipment available Patient identity confirmed: verbally with patient and arm band Time out: Immediately prior to procedure a "time out" was called to verify the correct patient, procedure, equipment, support staff and site/side marked as required. Preparation: Patient was prepped and draped in the usual sterile fashion. Local anesthesia used: yes Local anesthetic: bupivacaine 0.25% with epinephrine Anesthetic total: 3 ml Patient sedated: no Patient tolerance: Patient tolerated the procedure well with no immediate complications.   (including critical care time)  Labs Reviewed - No data to display No results found. 1. Pain, dental     MDM  Patient with toothache.  No gross abscess.  Exam unconcerning for Ludwig's angina or spread of infection.  Will treat with azithromycin due to pcn allergy and pain medicine.  Urged patient to follow-up with dentist as soon as possible tomorrow. Relief of symptoms with dental block.     Mora Bellman, PA-C 10/13/12 1451

## 2012-10-13 NOTE — ED Provider Notes (Signed)
Medical screening examination/treatment/procedure(s) were performed by non-physician practitioner and as supervising physician I was immediately available for consultation/collaboration.   Graycee Greeson, MD 10/13/12 1534 

## 2012-12-20 ENCOUNTER — Emergency Department (HOSPITAL_COMMUNITY)
Admission: EM | Admit: 2012-12-20 | Discharge: 2012-12-20 | Disposition: A | Discharge status: Home / Self Care | Payer: Self-pay | Attending: Emergency Medicine | Admitting: Emergency Medicine

## 2012-12-20 ENCOUNTER — Encounter (HOSPITAL_COMMUNITY): Payer: Self-pay | Admitting: *Deleted

## 2012-12-20 DIAGNOSIS — R0682 Tachypnea, not elsewhere classified: Secondary | ICD-10-CM | POA: Insufficient documentation

## 2012-12-20 DIAGNOSIS — Z880 Allergy status to penicillin: Secondary | ICD-10-CM | POA: Insufficient documentation

## 2012-12-20 DIAGNOSIS — F17200 Nicotine dependence, unspecified, uncomplicated: Secondary | ICD-10-CM | POA: Insufficient documentation

## 2012-12-20 DIAGNOSIS — F411 Generalized anxiety disorder: Secondary | ICD-10-CM | POA: Insufficient documentation

## 2012-12-20 DIAGNOSIS — Z792 Long term (current) use of antibiotics: Secondary | ICD-10-CM | POA: Insufficient documentation

## 2012-12-20 DIAGNOSIS — R1110 Vomiting, unspecified: Secondary | ICD-10-CM

## 2012-12-20 DIAGNOSIS — K2970 Gastritis, unspecified, without bleeding: Secondary | ICD-10-CM | POA: Insufficient documentation

## 2012-12-20 DIAGNOSIS — R064 Hyperventilation: Secondary | ICD-10-CM | POA: Insufficient documentation

## 2012-12-20 LAB — COMPREHENSIVE METABOLIC PANEL
ALT: 17 U/L (ref 0–53)
Calcium: 10.2 mg/dL (ref 8.4–10.5)
GFR calc Af Amer: >90 mL/min (ref >90)
Glucose, Bld: 106 mg/dL — ABNORMAL HIGH (ref 70–99)
Sodium: 138 mEq/L (ref 135–145)
Total Protein: 7.5 g/dL (ref 6.0–8.3)

## 2012-12-20 LAB — CBC
MCH: 31.4 pg (ref 26.0–34.0)
MCHC: 35.3 g/dL (ref 30.0–36.0)
Platelets: 244 10*3/uL (ref 150–400)

## 2012-12-20 LAB — CG4 I-STAT (LACTIC ACID): Lactic Acid, Venous: 4.44 mmol/L — ABNORMAL HIGH (ref 0.5–2.2)

## 2012-12-20 LAB — GLUCOSE, CAPILLARY: Glucose-Capillary: 116 mg/dL — ABNORMAL HIGH (ref 70–99)

## 2012-12-20 MED ORDER — LORAZEPAM 2 MG/ML IJ SOLN
INTRAMUSCULAR | Status: AC
  Filled 2012-12-20: qty 1

## 2012-12-20 MED ORDER — SODIUM CHLORIDE 0.9 % IV BOLUS (SEPSIS)
1000.0000 mL | Freq: Once | INTRAVENOUS | Status: AC
  Administered 2012-12-20: 1000 mL via INTRAVENOUS

## 2012-12-20 MED ORDER — ONDANSETRON HCL 4 MG/2ML IJ SOLN
4.0000 mg | Freq: Once | INTRAMUSCULAR | Status: AC
  Administered 2012-12-20: 4 mg via INTRAVENOUS
  Filled 2012-12-20: qty 2

## 2012-12-20 MED ORDER — FAMOTIDINE IN NACL 20-0.9 MG/50ML-% IV SOLN
20.0000 mg | Freq: Once | INTRAVENOUS | Status: AC
  Administered 2012-12-20: 20 mg via INTRAVENOUS
  Filled 2012-12-20: qty 50

## 2012-12-20 MED ORDER — ONDANSETRON HCL 4 MG PO TABS: 4.0000 mg | ORAL_TABLET | Freq: Four times a day (QID) | ORAL | Status: AC

## 2012-12-20 MED ORDER — MORPHINE SULFATE 4 MG/ML IJ SOLN
4.0000 mg | Freq: Once | INTRAMUSCULAR | Status: AC
  Administered 2012-12-20: 4 mg via INTRAVENOUS
  Filled 2012-12-20: qty 1

## 2012-12-20 MED ORDER — LORAZEPAM 2 MG/ML IJ SOLN
1.0000 mg | Freq: Once | INTRAMUSCULAR | Status: AC
  Administered 2012-12-20: 1 mg via INTRAVENOUS

## 2012-12-20 NOTE — ED Provider Notes (Signed)
CSN: [id number]     Arrival date & time 12/20/12  0245 History   First MD Initiated Contact with Patient 12/20/12 0255     Chief Complaint  Patient presents with  . Emesis   (Consider location/radiation/quality/duration/timing/severity/associated sxs/prior Treatment) HPI Comments: 22 year old male with no significant past medical history brought in to the emergency department by a friend complaining of abdominal pain and vomiting. Her friend, patient called him just prior to arrival stating that he could not stop vomiting he needed to be brought to the emergency department. Friend is unsure what he had been doing earlier in the night. When asking if patient has pain anywhere, he points to his mid-epigastric region of abdomen. Friend is unsure if he has been drinking. States when he arrived at his house, his eyes appeared to be ruled the back of his head. No known history of seizures. Patient is actively hyperventilating and dry heaving, not answering any questions and therefore qualifies as a level V caveat.  Patient is a 22 y.o. male presenting with vomiting. The history is provided by a friend.  Emesis   History reviewed. No pertinent past medical history. History reviewed. No pertinent past surgical history. Family History  Problem Relation Age of Onset  . Asthma Mother   . Asthma Sister   . Asthma Brother    History  Substance Use Topics  . Smoking status: Current Some Day Smoker -- 1.00 packs/day    Types: Cigarettes  . Smokeless tobacco: Never Used  . Alcohol Use: Yes     Comment: Occasional    Review of Systems  Unable to perform ROS: Acuity of condition  Gastrointestinal: Positive for vomiting.    Allergies  Penicillins  Home Medications   Current Outpatient Rx  Name  Route  Sig  Dispense  Refill  . acetaminophen (TYLENOL) 500 MG tablet   Oral   Take 1,500 mg by mouth every 6 (six) hours as needed for pain.         Marland Kitchen azithromycin (ZITHROMAX) 250 MG tablet  Oral   Take 1 tablet (250 mg total) by mouth daily. Take first 2 tablets together, then 1 every day until finished.   6 tablet   0   . traMADol (ULTRAM) 50 MG tablet   Oral   Take 1 tablet (50 mg total) by mouth every 6 (six) hours as needed for pain.   15 tablet   0    BP 142/89  Pulse 68  Temp(Src) 97.7 F (36.5 C) (Oral)  Resp 20  SpO2 100% Physical Exam  Nursing note and vitals reviewed. Constitutional: He is oriented to person, place, and time. He appears well-developed and well-nourished. He appears distressed.  Smells of vomit.  HENT:  Head: Normocephalic and atraumatic.  Mouth/Throat: Oropharynx is clear and moist.  Eyes: Conjunctivae and EOM are normal. Pupils are equal, round, and reactive to light.  Neck: Normal range of motion. Neck supple.  Cardiovascular: Normal rate, regular rhythm and normal heart sounds.   Pulmonary/Chest: Breath sounds normal. Tachypnea noted. He has no decreased breath sounds. He has no wheezes. He has no rhonchi. He has no rales.  Hyperventilating.  Abdominal: Soft. Bowel sounds are normal. He exhibits no distension and no mass. There is tenderness in the epigastric area. There is no rigidity, no rebound and no guarding.  Dry heaving.  Musculoskeletal: Normal range of motion. He exhibits no edema.  Neurological: He is alert and oriented to person, place, and time. He displays no  seizure activity.  Skin: Skin is warm and dry. He is not diaphoretic.  Psychiatric: His mood appears anxious.    ED Course  Procedures (including critical care time) Labs Review Labs Reviewed  COMPREHENSIVE METABOLIC PANEL - Abnormal; Notable for the following:    Glucose, Bld 106 (*)    GFR calc non Af Amer 88 (*)    All other components within normal limits  LIPASE, BLOOD - Abnormal; Notable for the following:    Lipase 65 (*)    All other components within normal limits  GLUCOSE, CAPILLARY - Abnormal; Notable for the following:    Glucose-Capillary 116  (*)    All other components within normal limits  CG4 I-STAT (LACTIC ACID) - Abnormal; Notable for the following:    Lactic Acid, Venous 4.44 (*)    All other components within normal limits  CBC  ETHANOL  URINE RAPID DRUG SCREEN (HOSP PERFORMED)   Imaging Review No results found.  MDM   1. Emesis   2. Gastritis   3. Hyperventilating     Patient presenting with emesis, hyperventillating. Normal vital signs on arrival. Dry heaving, no actual vomit. Patient was not answering questions initially- labs obtained, cbg 116, cbc normal, cmp normal, lipase 65, ETOH normal, lactic acid 4.44. After receiving ativan, zofran, morphine and fluids, he is resting comfortably, denying any pain or nausea. Abdomen is soft and non-tender on repeat exam. No seizure activity. Most likely diagnosis acute gastritis. He is stable for discharge. Return precautions given. Patient states understanding of treatment care plan and is agreeable. Case discussed with my attending Dr. Anitra Lauth who also evaluated patient and agrees with plan of care.   Trevor Mace, PA-C 12/20/12 [phone]

## 2012-12-20 NOTE — ED Provider Notes (Signed)
Medical screening examination/treatment/procedure(s) were conducted as a shared visit with non-physician practitioner(s) and myself.  I personally evaluated the patient during the encounter Pt presented and was dry heaving and shaking and hyperventilating and would not speak.  Pt's vs were wnl and pt points to abd pain.  Labs to eval for pancreatitis pending or possible gastritis.  Pt given supportive care.  Gwyneth Sprout, MD 12/20/12 [phone]

## 2012-12-20 NOTE — ED Notes (Signed)
Vomiting; abd pain; no diarrhea; chills; headache

## 2012-12-20 NOTE — ED Notes (Signed)
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

## 2012-12-20 NOTE — ED Notes (Signed)
POCT Lactic Acid given to Dr. Tanna Savoy

## 2012-12-20 NOTE — ED Notes (Signed)
Called 2 different people to come pick pt up  Left messages with both

## 2012-12-20 NOTE — ED Notes (Signed)
Pt contact person is Hydrographic surveyor at [phone]

## 2014-03-09 ENCOUNTER — Emergency Department (HOSPITAL_COMMUNITY): Payer: Self-pay

## 2014-03-09 ENCOUNTER — Emergency Department (HOSPITAL_COMMUNITY)
Admission: EM | Admit: 2014-03-09 | Discharge: 2014-03-09 | Disposition: A | Discharge status: Home / Self Care | Payer: Self-pay | Attending: Emergency Medicine | Admitting: Emergency Medicine

## 2014-03-09 ENCOUNTER — Encounter (HOSPITAL_COMMUNITY): Payer: Self-pay | Admitting: *Deleted

## 2014-03-09 DIAGNOSIS — R51 Headache: Secondary | ICD-10-CM | POA: Insufficient documentation

## 2014-03-09 DIAGNOSIS — R4182 Altered mental status, unspecified: Secondary | ICD-10-CM | POA: Insufficient documentation

## 2014-03-09 DIAGNOSIS — Z880 Allergy status to penicillin: Secondary | ICD-10-CM | POA: Insufficient documentation

## 2014-03-09 DIAGNOSIS — Z79899 Other long term (current) drug therapy: Secondary | ICD-10-CM | POA: Insufficient documentation

## 2014-03-09 DIAGNOSIS — R059 Cough, unspecified: Secondary | ICD-10-CM

## 2014-03-09 DIAGNOSIS — Z720 Tobacco use: Secondary | ICD-10-CM | POA: Insufficient documentation

## 2014-03-09 DIAGNOSIS — R05 Cough: Secondary | ICD-10-CM | POA: Insufficient documentation

## 2014-03-09 LAB — RAPID URINE DRUG SCREEN, HOSP PERFORMED
Amphetamines: NOT DETECTED
BARBITURATES: NOT DETECTED
Benzodiazepines: NOT DETECTED
Cocaine: POSITIVE — AB
Opiates: NOT DETECTED
Tetrahydrocannabinol: POSITIVE — AB

## 2014-03-09 LAB — CBC WITH DIFFERENTIAL/PLATELET
BASOS ABS: 0 10*3/uL (ref 0.0–0.1)
BASOS PCT: 0 % (ref 0–1)
EOS PCT: 0 % (ref 0–5)
Eosinophils Absolute: 0.1 10*3/uL (ref 0.0–0.7)
HEMATOCRIT: 42.8 % (ref 39.0–52.0)
HEMOGLOBIN: 14.7 g/dL (ref 13.0–17.0)
Lymphocytes Relative: 14 % (ref 12–46)
Lymphs Abs: 2.6 10*3/uL (ref 0.7–4.0)
MCH: 31.1 pg (ref 26.0–34.0)
MCHC: 34.3 g/dL (ref 30.0–36.0)
MCV: 90.7 fL (ref 78.0–100.0)
MONOS PCT: 11 % (ref 3–12)
Monocytes Absolute: 2.1 10*3/uL — ABNORMAL HIGH (ref 0.1–1.0)
Neutro Abs: 13.7 10*3/uL — ABNORMAL HIGH (ref 1.7–7.7)
Neutrophils Relative %: 75 % (ref 43–77)
Platelets: 270 10*3/uL (ref 150–400)
RBC: 4.72 MIL/uL (ref 4.22–5.81)
RDW: 12.3 % (ref 11.5–15.5)
WBC: 18.4 10*3/uL — ABNORMAL HIGH (ref 4.0–10.5)

## 2014-03-09 LAB — URINALYSIS, ROUTINE W REFLEX MICROSCOPIC
Bilirubin Urine: NEGATIVE
Glucose, UA: NEGATIVE mg/dL
Hgb urine dipstick: NEGATIVE
KETONES UR: NEGATIVE mg/dL
Leukocytes, UA: NEGATIVE
NITRITE: NEGATIVE
PROTEIN: NEGATIVE mg/dL
Specific Gravity, Urine: 1.026 (ref 1.005–1.030)
Urobilinogen, UA: 1 mg/dL (ref 0.0–1.0)
pH: 7 (ref 5.0–8.0)

## 2014-03-09 LAB — COMPREHENSIVE METABOLIC PANEL
ALBUMIN: 4 g/dL (ref 3.5–5.2)
ALT: 10 U/L (ref 0–53)
ANION GAP: 13 (ref 5–15)
AST: 17 U/L (ref 0–37)
Alkaline Phosphatase: 76 U/L (ref 39–117)
BILIRUBIN TOTAL: 0.6 mg/dL (ref 0.3–1.2)
BUN: 8 mg/dL (ref 6–23)
CHLORIDE: 104 meq/L (ref 96–112)
CO2: 23 mEq/L (ref 19–32)
CREATININE: 1.12 mg/dL (ref 0.50–1.35)
Calcium: 9.3 mg/dL (ref 8.4–10.5)
GFR calc Af Amer: >90 mL/min (ref >90)
Glucose, Bld: 86 mg/dL (ref 70–99)
Potassium: 4 mEq/L (ref 3.7–5.3)
Sodium: 140 mEq/L (ref 137–147)
Total Protein: 7.7 g/dL (ref 6.0–8.3)

## 2014-03-09 LAB — RAPID STREP SCREEN (MED CTR MEBANE ONLY): STREPTOCOCCUS, GROUP A SCREEN (DIRECT): NEGATIVE

## 2014-03-09 LAB — ETHANOL

## 2014-03-09 LAB — I-STAT CG4 LACTIC ACID, ED: Lactic Acid, Venous: 1.11 mmol/L (ref 0.5–2.2)

## 2014-03-09 MED ORDER — ACETAMINOPHEN 325 MG PO TABS
325.0000 mg | ORAL_TABLET | Freq: Once | ORAL | Status: AC
  Administered 2014-03-09: 325 mg via ORAL
  Filled 2014-03-09: qty 1

## 2014-03-09 MED ORDER — TRAMADOL HCL 50 MG PO TABS: 50.0000 mg | ORAL_TABLET | Freq: Four times a day (QID) | ORAL | Status: AC | PRN

## 2014-03-09 NOTE — Discharge Instructions (Signed)
Take the prescribed medication as directed. °Follow-up with your primary care physician. °Return to the ED for new or worsening symptoms. ° °

## 2014-03-09 NOTE — ED Provider Notes (Signed)
CSN: [id number]     Arrival date & time 03/09/14  1254 History   First MD Initiated Contact with Patient 03/09/14 1349     Chief Complaint  Patient presents with  . Fatigue  . Fever     (Consider location/radiation/quality/duration/timing/severity/associated sxs/prior Treatment) HPI Jay Ramirez is a 23 y.o. male with no medical problems, presents to ED with friends who state Jay Ramirez woke up this morning with weakness, unsteady balance, not responding. Jay Ramirez apparently complained of headache and cough last night. Jay Ramirez took tylenol this morning. Friends deny drugs or alcohol. Jay Ramirez not speaking. No other history provided at this time.   History reviewed. No pertinent past medical history. History reviewed. No pertinent past surgical history. Family History  Problem Relation Age of Onset  . Asthma Mother   . Asthma Sister   . Asthma Brother    History  Substance Use Topics  . Smoking status: Current Some Day Smoker -- 1.00 packs/day    Types: Cigarettes  . Smokeless tobacco: Never Used  . Alcohol Use: Yes     Comment: Occasional    Review of Systems  Unable to perform ROS: Mental status change  Respiratory: Positive for cough.   Neurological: Positive for weakness and headaches.      Allergies  Penicillins  Home Medications   Prior to Admission medications   Medication Sig Start Date End Date Taking? Authorizing Provider  acetaminophen (TYLENOL) 500 MG tablet Take 1,500 mg by mouth every 6 (six) hours as needed for pain.    Historical Provider, MD  ondansetron (ZOFRAN) 4 MG tablet Take 1 tablet (4 mg total) by mouth every 6 (six) hours. 12/20/12   Robyn M Hess, PA-C  traMADol (ULTRAM) 50 MG tablet Take 1 tablet (50 mg total) by mouth every 6 (six) hours as needed for pain. 10/07/12   Kaitlyn Szekalski, PA-C   BP 113/59 mmHg  Pulse 82  Temp(Src) 98.3 F (36.8 C) (Oral)  Resp 18  SpO2 100% Physical Exam  Constitutional: He appears well-developed and well-nourished.  Jay Ramirez  sleeping  HENT:  Head: Normocephalic and atraumatic.  Nose: Nose normal.  Mouth/Throat: Oropharynx is clear and moist.  Eyes: Conjunctivae and EOM are normal. Pupils are equal, round, and reactive to light.  Neck: Normal range of motion. Neck supple.  No meningismus  Cardiovascular: Normal rate, regular rhythm and normal heart sounds.   Pulmonary/Chest: Effort normal. No respiratory distress. He has no wheezes. He has no rales.  Abdominal: Soft. Bowel sounds are normal. He exhibits no distension. There is no tenderness. There is no rebound.  Musculoskeletal: He exhibits no edema.  Neurological: He is alert. No cranial nerve deficit. He exhibits normal muscle tone.  Responds to verbal and painful stimuli, but does not follow directions  Skin: Skin is warm and dry.  Nursing note and vitals reviewed.   ED Course  Procedures (including critical care time) Labs Review Labs Reviewed  CBC WITH DIFFERENTIAL - Abnormal; Notable for the following:    WBC 18.4 (*)    Neutro Abs 13.7 (*)    Monocytes Absolute 2.1 (*)    All other components within normal limits  URINALYSIS, ROUTINE W REFLEX MICROSCOPIC - Abnormal; Notable for the following:    APPearance HAZY (*)    All other components within normal limits  URINE RAPID DRUG SCREEN (HOSP PERFORMED) - Abnormal; Notable for the following:    Cocaine POSITIVE (*)    Tetrahydrocannabinol POSITIVE (*)    All other components within  normal limits  RAPID STREP SCREEN  CULTURE, GROUP A STREP  COMPREHENSIVE METABOLIC PANEL  ETHANOL  I-STAT CG4 LACTIC ACID, ED    Imaging Review No results found.   EKG Interpretation None      MDM   Final diagnoses:  Cough  Altered mental status, unspecified altered mental status type    Jay Ramirez here with AMS. Apparently had headache and cough yesterday. VS normal. Jay Ramirez appears sedated, wakes up to verbal or painful stimuli only. Uncooperative. Will get labs, drug screen, cxr, rectal temp.   4:10  PM Rectal temp 99.1. Jay Ramirez is slightly more awake now. Complaining of headache. No nuchal rigidity. Moving all extremitieis. PERRLA.  Tylenol ordered for headache. WBC 18, lactate normal.     4:17 PM Pending CXR. Signed out with PA Sanders at shift change pending CXR. Plan to ambulate, make sure Jay Ramirez able to drink fluids. If back to baseline, d/c home with symptomatic tx for headache and URI symptoms. If not back to baseline, admit. VS normal.   Filed Vitals:   03/09/14 1310 03/09/14 1500 03/09/14 1557  BP: 113/59 119/64 129/72  Pulse: 82    Temp: 98.3 F (36.8 C)    TempSrc: Oral    Resp: 18 20   SpO2: 100%       Lottie Musselatyana A Danon Lograsso, PA-C 03/12/14 1820  Suzi RootsKevin E Steinl, MD 03/16/14 0900

## 2014-03-09 NOTE — ED Notes (Addendum)
Pt lethargic at triage but is responsive to verbal stimuli. Friend reports pt had headache and cough last night. Then woke up this am with fatigue, weakness, chills, unsteady balance, not eating or drinking. Denies drug use. Airway intact.
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
## 2014-03-12 LAB — CULTURE, GROUP A STREP

## 2015-01-17 ENCOUNTER — Emergency Department (HOSPITAL_COMMUNITY): Payer: Self-pay | Admitting: Anesthesiology

## 2015-01-17 ENCOUNTER — Emergency Department (HOSPITAL_COMMUNITY): Payer: Self-pay

## 2015-01-17 ENCOUNTER — Emergency Department (HOSPITAL_COMMUNITY)
Admission: EM | Admit: 2015-01-17 | Discharge: 2015-01-19 | Disposition: E | Payer: Self-pay | Attending: Emergency Medicine | Admitting: Emergency Medicine

## 2015-01-17 ENCOUNTER — Encounter (HOSPITAL_COMMUNITY): Admission: EM | Disposition: E | Payer: Self-pay | Source: Home / Self Care | Attending: Emergency Medicine

## 2015-01-17 DIAGNOSIS — Z1810 Retained metal fragments, unspecified: Secondary | ICD-10-CM | POA: Insufficient documentation

## 2015-01-17 DIAGNOSIS — I469 Cardiac arrest, cause unspecified: Secondary | ICD-10-CM | POA: Insufficient documentation

## 2015-01-17 DIAGNOSIS — S61202A Unspecified open wound of right middle finger without damage to nail, initial encounter: Secondary | ICD-10-CM | POA: Insufficient documentation

## 2015-01-17 DIAGNOSIS — S21302A Unspecified open wound of left front wall of thorax with penetration into thoracic cavity, initial encounter: Secondary | ICD-10-CM | POA: Insufficient documentation

## 2015-01-17 DIAGNOSIS — W3400XA Accidental discharge from unspecified firearms or gun, initial encounter: Secondary | ICD-10-CM

## 2015-01-17 DIAGNOSIS — R402432 Glasgow coma scale score 3-8, at arrival to emergency department: Secondary | ICD-10-CM | POA: Insufficient documentation

## 2015-01-17 DIAGNOSIS — S2601XA Contusion of heart with hemopericardium, initial encounter: Secondary | ICD-10-CM | POA: Insufficient documentation

## 2015-01-17 DIAGNOSIS — J9811 Atelectasis: Secondary | ICD-10-CM | POA: Insufficient documentation

## 2015-01-17 DIAGNOSIS — T794XXA Traumatic shock, initial encounter: Secondary | ICD-10-CM | POA: Insufficient documentation

## 2015-01-17 DIAGNOSIS — J90 Pleural effusion, not elsewhere classified: Secondary | ICD-10-CM | POA: Insufficient documentation

## 2015-01-17 DIAGNOSIS — R000 Tachycardia, unspecified: Secondary | ICD-10-CM | POA: Insufficient documentation

## 2015-01-17 DIAGNOSIS — S271XXA Traumatic hemothorax, initial encounter: Secondary | ICD-10-CM | POA: Insufficient documentation

## 2015-01-17 DIAGNOSIS — S21309A Unspecified open wound of unspecified front wall of thorax with penetration into thoracic cavity, initial encounter: Secondary | ICD-10-CM

## 2015-01-17 HISTORY — PX: THORACOTOMY: SHX5074

## 2015-01-17 LAB — COMPREHENSIVE METABOLIC PANEL
ALBUMIN: 2.8 g/dL — AB (ref 3.5–5.0)
ALK PHOS: 49 U/L (ref 38–126)
ALT: 90 U/L — AB (ref 17–63)
ANION GAP: 36 — AB (ref 5–15)
AST: 157 U/L — ABNORMAL HIGH (ref 15–41)
BUN: 10 mg/dL (ref 6–20)
CALCIUM: 10 mg/dL (ref 8.9–10.3)
CHLORIDE: 101 mmol/L (ref 101–111)
CO2: 8 mmol/L — AB (ref 22–32)
Creatinine, Ser: 2.01 mg/dL — ABNORMAL HIGH (ref 0.61–1.24)
GFR calc Af Amer: 52 mL/min — ABNORMAL LOW (ref >60)
GFR calc non Af Amer: 45 mL/min — ABNORMAL LOW (ref >60)
GLUCOSE: 321 mg/dL — AB (ref 65–99)
Potassium: 5.3 mmol/L — ABNORMAL HIGH (ref 3.5–5.1)
SODIUM: 145 mmol/L (ref 135–145)
Total Bilirubin: 0.8 mg/dL (ref 0.3–1.2)
Total Protein: 4.8 g/dL — ABNORMAL LOW (ref 6.5–8.1)

## 2015-01-17 LAB — I-STAT TROPONIN, ED: TROPONIN I, POC: 9.97 ng/mL — AB (ref 0.00–0.08)

## 2015-01-17 LAB — I-STAT CG4 LACTIC ACID, ED: Lactic Acid, Venous: >17 mmol/L (ref 0.5–2.0)

## 2015-01-17 LAB — PROTIME-INR
INR: 4.17 — ABNORMAL HIGH (ref 0.00–1.49)
Prothrombin Time: 39.2 seconds — ABNORMAL HIGH (ref 11.6–15.2)

## 2015-01-17 LAB — ETHANOL

## 2015-01-17 LAB — CBC
HEMATOCRIT: 38.4 % — AB (ref 39.0–52.0)
HEMOGLOBIN: 11.2 g/dL — AB (ref 13.0–17.0)
MCH: 30.8 pg (ref 26.0–34.0)
MCHC: 29.2 g/dL — ABNORMAL LOW (ref 30.0–36.0)
MCV: 105.5 fL — AB (ref 78.0–100.0)
Platelets: ADEQUATE 10*3/uL (ref 150–400)
RBC: 3.64 MIL/uL — ABNORMAL LOW (ref 4.22–5.81)
RDW: 13 % (ref 11.5–15.5)
WBC: 7 10*3/uL (ref 4.0–10.5)

## 2015-01-17 LAB — CDS SEROLOGY

## 2015-01-17 LAB — BLOOD PRODUCT ORDER (VERBAL) VERIFICATION

## 2015-01-17 SURGERY — THORACOTOMY, MAJOR
Anesthesia: General | Site: Chest

## 2015-01-17 MED ORDER — SODIUM BICARBONATE 8.4 % IV SOLN
INTRAVENOUS | Status: DC | PRN
  Administered 2015-01-17: 50 meq via INTRAVENOUS

## 2015-01-17 MED ORDER — ATROPINE SULFATE 1 MG/ML IJ SOLN
INTRAMUSCULAR | Status: DC | PRN
  Administered 2015-01-17 (×4): 1 mg via INTRAVENOUS

## 2015-01-17 MED ORDER — IOHEXOL 350 MG/ML SOLN
100.0000 mL | Freq: Once | INTRAVENOUS | Status: AC | PRN
  Administered 2015-01-17: 100 mL via INTRAVENOUS

## 2015-01-17 MED ORDER — EPINEPHRINE HCL 0.1 MG/ML IJ SOSY
PREFILLED_SYRINGE | INTRAMUSCULAR | Status: DC | PRN
  Administered 2015-01-17 (×8): 1 mg via INTRAVENOUS

## 2015-01-17 MED ORDER — SODIUM CHLORIDE 0.9 % IV SOLN
INTRAVENOUS | Status: DC | PRN
  Administered 2015-01-17: 04:00:00 via INTRAVENOUS

## 2015-01-17 SURGICAL SUPPLY — 60 items
ADH SKN CLS APL DERMABOND .7 (GAUZE/BANDAGES/DRESSINGS)
BAG DECANTER FOR FLEXI CONT (MISCELLANEOUS) IMPLANT
CANISTER SUCTION 2500CC (MISCELLANEOUS) ×1 IMPLANT
CATH KIT ON Q 5IN SLV (PAIN MANAGEMENT) IMPLANT
CATH THORACIC 28FR (CATHETERS) IMPLANT
CATH THORACIC 28FR RT ANG (CATHETERS) IMPLANT
CATH THORACIC 36FR (CATHETERS) IMPLANT
CATH THORACIC 36FR RT ANG (CATHETERS) IMPLANT
CHERRY SPONGEY 1/2 (GAUZE/BANDAGES/DRESSINGS) IMPLANT
CONT SPEC 4OZ CLIKSEAL STRL BL (MISCELLANEOUS) ×2 IMPLANT
COVER SURGICAL LIGHT HANDLE (MISCELLANEOUS) ×2 IMPLANT
DERMABOND ADVANCED (GAUZE/BANDAGES/DRESSINGS)
DERMABOND ADVANCED .7 DNX12 (GAUZE/BANDAGES/DRESSINGS) IMPLANT
DRAPE LAPAROSCOPIC ABDOMINAL (DRAPES) ×1 IMPLANT
DRAPE WARM FLUID 44X44 (DRAPE) ×2 IMPLANT
ELECT REM PT RETURN 9FT ADLT (ELECTROSURGICAL)
ELECTRODE REM PT RTRN 9FT ADLT (ELECTROSURGICAL) ×1 IMPLANT
FLUID NSS /IRRIG 3000 ML XXX (IV SOLUTION) IMPLANT
GAUZE SPONGE 4X4 12PLY STRL (GAUZE/BANDAGES/DRESSINGS) ×1 IMPLANT
GLOVE ORTHO TXT STRL SZ7.5 (GLOVE) ×2 IMPLANT
GOWN STRL REUS W/ TWL LRG LVL3 (GOWN DISPOSABLE) ×2 IMPLANT
GOWN STRL REUS W/TWL LRG LVL3 (GOWN DISPOSABLE)
HEMOSTAT SURGICEL 2X14 (HEMOSTASIS) IMPLANT
KIT BASIN OR (CUSTOM PROCEDURE TRAY) ×1 IMPLANT
KIT ROOM TURNOVER OR (KITS) ×2 IMPLANT
KIT SUCTION CATH 14FR (SUCTIONS) ×1 IMPLANT
NS IRRIG 1000ML POUR BTL (IV SOLUTION) ×4 IMPLANT
PACK CHEST (CUSTOM PROCEDURE TRAY) ×1 IMPLANT
PAD ARMBOARD 7.5X6 YLW CONV (MISCELLANEOUS) ×2 IMPLANT
SEALANT SURG COSEAL 4ML (VASCULAR PRODUCTS) IMPLANT
SET IRRIG TUBING LAPAROSCOPIC (IRRIGATION / IRRIGATOR) IMPLANT
SOLUTION ANTI FOG 6CC (MISCELLANEOUS) ×1 IMPLANT
SPECIMEN JAR LG PLASTIC EMPTY (MISCELLANEOUS) IMPLANT
SPECIMEN JAR MEDIUM (MISCELLANEOUS) IMPLANT
STAPLER VISISTAT 35W (STAPLE) IMPLANT
SUT PROLENE 3 0 SH DA (SUTURE) IMPLANT
SUT PROLENE 4 0 RB 1 (SUTURE)
SUT PROLENE 4-0 RB1 .5 CRCL 36 (SUTURE) IMPLANT
SUT SILK  1 MH (SUTURE)
SUT SILK 1 MH (SUTURE) ×1 IMPLANT
SUT SILK 1 TIES 10X30 (SUTURE) IMPLANT
SUT SILK 2 0SH CR/8 30 (SUTURE) IMPLANT
SUT SILK 3 0SH CR/8 30 (SUTURE) IMPLANT
SUT VIC AB 2-0 CT1 27 (SUTURE)
SUT VIC AB 2-0 CT1 TAPERPNT 27 (SUTURE) IMPLANT
SUT VIC AB 2-0 CTX 36 (SUTURE) IMPLANT
SUT VIC AB 3-0 MH 27 (SUTURE) IMPLANT
SUT VIC AB 3-0 SH 27 (SUTURE)
SUT VIC AB 3-0 SH 27X BRD (SUTURE) IMPLANT
SUT VICRYL 0 UR6 27IN ABS (SUTURE) IMPLANT
SUT VICRYL 2 TP 1 (SUTURE) IMPLANT
SWAB COLLECTION DEVICE MRSA (MISCELLANEOUS) IMPLANT
SYSTEM SAHARA CHEST DRAIN ATS (WOUND CARE) ×1 IMPLANT
TAPE CLOTH 4X10 WHT NS (GAUZE/BANDAGES/DRESSINGS) IMPLANT
TOWEL OR 17X24 6PK STRL BLUE (TOWEL DISPOSABLE) ×1 IMPLANT
TOWEL OR 17X26 10 PK STRL BLUE (TOWEL DISPOSABLE) IMPLANT
TRAP SPECIMEN MUCOUS 40CC (MISCELLANEOUS) IMPLANT
TRAY FOLEY CATH 14FRSI W/METER (CATHETERS) IMPLANT
TUBE ANAEROBIC SPECIMEN COL (MISCELLANEOUS) IMPLANT
WATER STERILE IRR 1000ML POUR (IV SOLUTION) ×2 IMPLANT

## 2015-01-18 ENCOUNTER — Encounter (HOSPITAL_COMMUNITY): Payer: Self-pay | Admitting: Thoracic Surgery (Cardiothoracic Vascular Surgery)

## 2015-01-18 LAB — TYPE AND SCREEN
ABO/RH(D): B POS
Antibody Screen: NEGATIVE
UNIT DIVISION: 0
UNIT DIVISION: 0
Unit division: 0
Unit division: 0
Unit division: 0
Unit division: 0

## 2015-01-18 MED FILL — Medication: Qty: 1 | Status: AC

## 2015-01-18 NOTE — Anesthesia Postprocedure Evaluation (Signed)
Pt Died in OR

## 2015-01-19 NOTE — OR Nursing (Signed)
[employer] arrived to OR 10 at 0505. Per officers, room is on lock down for investigation. Per officers and OR charge RN, RN's in room were told to leave that they were no longer needed.
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
## 2015-01-19 NOTE — ED Notes (Signed)
1 epi given, pulse check, compressions resumed

## 2015-01-19 NOTE — ED Notes (Signed)
Patient arrived to room at 0248

## 2015-01-19 NOTE — ED Notes (Signed)
20 of etomidate given by charge rn, morgan

## 2015-01-19 NOTE — ED Notes (Signed)
2nd unit of emergent blood started.  

## 2015-01-19 NOTE — Progress Notes (Signed)
Pt was measured to be 68 inches. 8cc Tidal Volume is 547. Pt was placed on Vent prior to transport to OR. VT 540, RR20, PEEP +5, FiO2 100%. MD at bedside during transport to OR.
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
## 2015-01-19 NOTE — ED Notes (Signed)
1 epi given, pulse check at 0308, sinus tach on monitor, femoral pulse present, rate 157.
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
## 2015-01-19 NOTE — ED Notes (Signed)
Pulse check at 0303, compressions restarted

## 2015-01-19 NOTE — ED Notes (Signed)
100 of roc given for intubation by dr. Wilkie AyeHorton.

## 2015-01-19 NOTE — ED Notes (Signed)
Warm blanket given

## 2015-01-19 NOTE — ED Notes (Signed)
1st unit of emergent blood started on pressure bag.

## 2015-01-19 NOTE — Anesthesia Postprocedure Evaluation (Incomplete)
  Anesthesia Post-op Note  Patient: Jay Ramirez  Procedure(s) Performed: Procedure(s): THORACOTOMY MAJOR (Left)  Patient Location: PACU and Pt death pronounced 04:10  Anesthesia Type:General  Level of Consciousness: {FINDINGS; ANE POST LEVEL OF CONSCIOUSNESS:19484}  Airway and Oxygen Therapy: pt death at 04:10  Post-op Pain: death at 0410  Post-op Assessment: PATIENT'S CARDIOVASCULAR STATUS UNSTABLE and RESPIRATORY FUNCTION UNSTABLE              Post-op Vital Signs: unstable  Last Vitals:  Filed Vitals:   Mar 21, 2014 0310  BP: 213/88  Pulse: 97  Resp: 11    Complications: death

## 2015-01-19 NOTE — ED Notes (Signed)
Portable xray present for imaging.

## 2015-01-19 NOTE — ED Notes (Signed)
1 epi given 

## 2015-01-19 NOTE — OR Nursing (Signed)
[facility] called at 0430, per donor services pt is eligible for tissue and eye donation. Ref # from call service- [id number]. Per Felipa Ethobert Kiser, [facility], new Ref # [id number].

## 2015-01-19 NOTE — Transfer of Care (Signed)
Pt death pronounced at 04:10
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
## 2015-01-19 NOTE — Progress Notes (Signed)
Pt back from CT. Pt was manually ventilated to and from CT. Pt getting ready to go to OR, Per MD
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
## 2015-01-19 NOTE — ED Notes (Signed)
Dr Wilkie Ayehorton at the bedside, pulse present on arrival, agonal breath sounds

## 2015-01-19 NOTE — ED Notes (Signed)
Called Dr. Barry DienesWENS FOR DR. Corliss SkainsSUEI AT appx 03:18AM

## 2015-01-19 DEATH — deceased

## 2015-01-21 ENCOUNTER — Encounter (HOSPITAL_COMMUNITY): Payer: Self-pay | Admitting: *Deleted

## 2015-01-21 NOTE — Addendum Note (Signed)
Addendum  created 01/21/15 0119 by Arta BruceKevin Kalonji Zurawski, MD   Modules edited: Anesthesia Responsible Staff
